# Patient Record
Sex: Female | Born: 1990 | Hispanic: Yes | Marital: Married | Smoking: Never smoker
Health system: Southern US, Community
[De-identification: ages and names within clinical notes are randomized; demographics above are authoritative.]

## PROBLEM LIST (undated history)

## (undated) DIAGNOSIS — D649 Anemia, unspecified: Secondary | ICD-10-CM

## (undated) HISTORY — DX: Anemia, unspecified: D64.9

## (undated) HISTORY — PX: CHOLECYSTECTOMY: SHX55

---
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
## 2010-06-02 ENCOUNTER — Emergency Department: Payer: Self-pay | Admitting: Emergency Medicine

## 2010-07-06 ENCOUNTER — Emergency Department: Payer: Self-pay | Admitting: Unknown Physician Specialty

## 2013-09-04 ENCOUNTER — Emergency Department: Payer: Self-pay | Admitting: Emergency Medicine

## 2013-09-04 LAB — URINALYSIS, COMPLETE
Bilirubin,UR: NEGATIVE
Glucose,UR: NEGATIVE mg/dL (ref 0–75)
Nitrite: NEGATIVE
Ph: 5 (ref 4.5–8.0)
Protein: NEGATIVE
RBC,UR: 6 /HPF (ref 0–5)
Specific Gravity: 1.019 (ref 1.003–1.030)
WBC UR: 18 /HPF (ref 0–5)

## 2013-09-04 LAB — CBC
HCT: 37 % (ref 35.0–47.0)
HGB: 13.1 g/dL (ref 12.0–16.0)
MCH: 29.1 pg (ref 26.0–34.0)
MCHC: 35.5 g/dL (ref 32.0–36.0)
MCV: 82 fL (ref 80–100)
RDW: 13.1 % (ref 11.5–14.5)
WBC: 9.9 10*3/uL (ref 3.6–11.0)

## 2013-09-04 LAB — HCG, QUANTITATIVE, PREGNANCY: Beta Hcg, Quant.: 61846 m[IU]/mL — ABNORMAL HIGH

## 2013-11-11 ENCOUNTER — Emergency Department: Payer: Self-pay | Admitting: Emergency Medicine

## 2013-11-11 LAB — RAPID INFLUENZA A&B ANTIGENS

## 2013-11-14 LAB — BETA STREP CULTURE(ARMC)

## 2014-03-19 ENCOUNTER — Observation Stay: Payer: Self-pay | Admitting: Obstetrics and Gynecology

## 2014-03-19 LAB — URINALYSIS, COMPLETE
BILIRUBIN, UR: NEGATIVE
Blood: NEGATIVE
Glucose,UR: 150 mg/dL (ref 0–75)
KETONE: NEGATIVE
Nitrite: NEGATIVE
PH: 6 (ref 4.5–8.0)
PROTEIN: NEGATIVE
RBC,UR: <1 /HPF (ref 0–5)
Specific Gravity: 1.015 (ref 1.003–1.030)
WBC UR: 1 /HPF (ref 0–5)

## 2014-04-29 ENCOUNTER — Emergency Department: Payer: Self-pay | Admitting: Emergency Medicine

## 2014-04-29 LAB — COMPREHENSIVE METABOLIC PANEL
ALK PHOS: 212 U/L — AB
ALT: 26 U/L (ref 12–78)
Albumin: 2.4 g/dL — ABNORMAL LOW (ref 3.4–5.0)
Anion Gap: 9 (ref 7–16)
BUN: 3 mg/dL — ABNORMAL LOW (ref 7–18)
Bilirubin,Total: 0.3 mg/dL (ref 0.2–1.0)
CREATININE: 0.51 mg/dL — AB (ref 0.60–1.30)
Calcium, Total: 8.7 mg/dL (ref 8.5–10.1)
Chloride: 111 mmol/L — ABNORMAL HIGH (ref 98–107)
Co2: 22 mmol/L (ref 21–32)
EGFR (African American): >60
EGFR (Non-African Amer.): >60
Glucose: 76 mg/dL (ref 65–99)
Osmolality: 278 (ref 275–301)
Potassium: 3.6 mmol/L (ref 3.5–5.1)
SGOT(AST): 42 U/L — ABNORMAL HIGH (ref 15–37)
SODIUM: 142 mmol/L (ref 136–145)
Total Protein: 6.5 g/dL (ref 6.4–8.2)

## 2014-04-29 LAB — CBC
HCT: 24.6 % — ABNORMAL LOW (ref 35.0–47.0)
HGB: 8 g/dL — AB (ref 12.0–16.0)
MCH: 24.6 pg — AB (ref 26.0–34.0)
MCHC: 32.5 g/dL (ref 32.0–36.0)
MCV: 76 fL — ABNORMAL LOW (ref 80–100)
Platelet: 270 10*3/uL (ref 150–440)
RBC: 3.26 10*6/uL — ABNORMAL LOW (ref 3.80–5.20)
RDW: 16.8 % — AB (ref 11.5–14.5)
WBC: 5.1 10*3/uL (ref 3.6–11.0)

## 2014-04-29 LAB — URINALYSIS, COMPLETE
BACTERIA: NONE SEEN
Bilirubin,UR: NEGATIVE
GLUCOSE, UR: NEGATIVE mg/dL (ref 0–75)
KETONE: NEGATIVE
NITRITE: NEGATIVE
Ph: 6 (ref 4.5–8.0)
Protein: 100
RBC,UR: 1933 /HPF (ref 0–5)
Specific Gravity: 1.013 (ref 1.003–1.030)
WBC UR: 272 /HPF (ref 0–5)

## 2014-05-05 DIAGNOSIS — G510 Bell's palsy: Secondary | ICD-10-CM

## 2014-05-05 HISTORY — DX: Bell's palsy: G51.0

## 2014-07-17 DIAGNOSIS — E669 Obesity, unspecified: Secondary | ICD-10-CM | POA: Insufficient documentation

## 2015-09-04 ENCOUNTER — Emergency Department
Admission: EM | Admit: 2015-09-04 | Discharge: 2015-09-04 | Disposition: A | Discharge status: Home / Self Care | Payer: Self-pay | Attending: Emergency Medicine | Admitting: Emergency Medicine

## 2015-09-04 ENCOUNTER — Emergency Department: Payer: Self-pay

## 2015-09-04 DIAGNOSIS — K8020 Calculus of gallbladder without cholecystitis without obstruction: Secondary | ICD-10-CM | POA: Insufficient documentation

## 2015-09-04 DIAGNOSIS — Z3202 Encounter for pregnancy test, result negative: Secondary | ICD-10-CM | POA: Insufficient documentation

## 2015-09-04 DIAGNOSIS — K2970 Gastritis, unspecified, without bleeding: Secondary | ICD-10-CM | POA: Insufficient documentation

## 2015-09-04 LAB — COMPREHENSIVE METABOLIC PANEL
ALT: 98 U/L — ABNORMAL HIGH (ref 14–54)
ANION GAP: 6 (ref 5–15)
AST: 50 U/L — ABNORMAL HIGH (ref 15–41)
Albumin: 4.2 g/dL (ref 3.5–5.0)
Alkaline Phosphatase: 95 U/L (ref 38–126)
BILIRUBIN TOTAL: 0.7 mg/dL (ref 0.3–1.2)
BUN: 14 mg/dL (ref 6–20)
CALCIUM: 9.5 mg/dL (ref 8.9–10.3)
CO2: 27 mmol/L (ref 22–32)
Chloride: 106 mmol/L (ref 101–111)
Creatinine, Ser: 0.51 mg/dL (ref 0.44–1.00)
GFR calc non Af Amer: >60 mL/min (ref >60)
Glucose, Bld: 114 mg/dL — ABNORMAL HIGH (ref 65–99)
Potassium: 3.9 mmol/L (ref 3.5–5.1)
SODIUM: 139 mmol/L (ref 135–145)
TOTAL PROTEIN: 7.7 g/dL (ref 6.5–8.1)

## 2015-09-04 LAB — CBC
HCT: 39.5 % (ref 35.0–47.0)
HEMOGLOBIN: 13.6 g/dL (ref 12.0–16.0)
MCH: 28.6 pg (ref 26.0–34.0)
MCHC: 34.4 g/dL (ref 32.0–36.0)
MCV: 83.2 fL (ref 80.0–100.0)
Platelets: 227 10*3/uL (ref 150–440)
RBC: 4.74 MIL/uL (ref 3.80–5.20)
RDW: 13.2 % (ref 11.5–14.5)
WBC: 7.9 10*3/uL (ref 3.6–11.0)

## 2015-09-04 LAB — URINALYSIS COMPLETE WITH MICROSCOPIC (ARMC ONLY)
Bacteria, UA: NONE SEEN
Bilirubin Urine: NEGATIVE
Glucose, UA: NEGATIVE mg/dL
Hgb urine dipstick: NEGATIVE
KETONES UR: NEGATIVE mg/dL
LEUKOCYTES UA: NEGATIVE
NITRITE: NEGATIVE
PH: 5 (ref 5.0–8.0)
PROTEIN: NEGATIVE mg/dL
SPECIFIC GRAVITY, URINE: 1.016 (ref 1.005–1.030)

## 2015-09-04 LAB — POCT PREGNANCY, URINE: Preg Test, Ur: NEGATIVE

## 2015-09-04 LAB — LIPASE, BLOOD: Lipase: 36 U/L (ref 11–51)

## 2015-09-04 MED ORDER — METOCLOPRAMIDE HCL 5 MG/ML IJ SOLN
10.0000 mg | Freq: Once | INTRAMUSCULAR | Status: AC
  Administered 2015-09-04: 10 mg via INTRAVENOUS
  Filled 2015-09-04: qty 2

## 2015-09-04 MED ORDER — ONDANSETRON 8 MG PO TBDP: 8.0000 mg | ORAL_TABLET | Freq: Three times a day (TID) | ORAL | Status: DC | PRN

## 2015-09-04 MED ORDER — FAMOTIDINE IN NACL 20-0.9 MG/50ML-% IV SOLN
20.0000 mg | Freq: Once | INTRAVENOUS | Status: AC
  Administered 2015-09-04: 20 mg via INTRAVENOUS
  Filled 2015-09-04: qty 50

## 2015-09-04 MED ORDER — RANITIDINE HCL 150 MG PO CAPS: 150.0000 mg | ORAL_CAPSULE | Freq: Two times a day (BID) | ORAL | Status: DC

## 2015-09-04 NOTE — Discharge Instructions (Signed)
Cholelithiasis Cholelithiasis (also called gallstones) is a form of gallbladder disease in which gallstones form in your gallbladder. The gallbladder is an organ that stores bile made in the liver, which helps digest fats. Gallstones begin as small crystals and slowly grow into stones. Gallstone pain occurs when the gallbladder spasms and a gallstone is blocking the duct. Pain can also occur when a stone passes out of the duct.  RISK FACTORS  Being female.   Having multiple pregnancies. Health care providers sometimes advise removing diseased gallbladders before future pregnancies.   Being obese.  Eating a diet heavy in fried foods and fat.   Being older than 60 years and increasing age.   Prolonged use of medicines containing female hormones.   Having diabetes mellitus.   Rapidly losing weight.   Having a family history of gallstones (heredity).  SYMPTOMS  Nausea.   Vomiting.  Abdominal pain.   Yellowing of the skin (jaundice).   Sudden pain. It may persist from several minutes to several hours.  Fever.   Tenderness to the touch. In some cases, when gallstones do not move into the bile duct, people have no pain or symptoms. These are called "silent" gallstones.  TREATMENT Silent gallstones do not need treatment. In severe cases, emergency surgery may be required. Options for treatment include:  Surgery to remove the gallbladder. This is the most common treatment.  Medicines. These do not always work and may take 6-12 months or more to work.  Shock wave treatment (extracorporeal biliary lithotripsy). In this treatment an ultrasound machine sends shock waves to the gallbladder to break gallstones into smaller pieces that can pass into the intestines or be dissolved by medicine. HOME CARE INSTRUCTIONS   Only take over-the-counter or prescription medicines for pain, discomfort, or fever as directed by your health care provider.   Follow a low-fat diet until  seen again by your health care provider. Fat causes the gallbladder to contract, which can result in pain.   Follow up with your health care provider as directed. Attacks are almost always recurrent and surgery is usually required for permanent treatment.  SEEK IMMEDIATE MEDICAL CARE IF:   Your pain increases and is not controlled by medicines.   You have a fever or persistent symptoms for more than 2-3 days.   You have a fever and your symptoms suddenly get worse.   You have persistent nausea and vomiting.  MAKE SURE YOU:   Understand these instructions.  Will watch your condition.  Will get help right away if you are not doing well or get worse.   This information is not intended to replace advice given to you by your health care provider. Make sure you discuss any questions you have with your health care provider.   Document Released: 10/19/2005 Document Revised: 06/25/2013 Document Reviewed: 04/16/2013 Elsevier Interactive Patient Education 2016 Elsevier Inc.  Gastritis, Adult Gastritis is soreness and swelling (inflammation) of the lining of the stomach. Gastritis can develop as a sudden onset (acute) or long-term (chronic) condition. If gastritis is not treated, it can lead to stomach bleeding and ulcers. CAUSES  Gastritis occurs when the stomach lining is weak or damaged. Digestive juices from the stomach then inflame the weakened stomach lining. The stomach lining may be weak or damaged due to viral or bacterial infections. One common bacterial infection is the Helicobacter pylori infection. Gastritis can also result from excessive alcohol consumption, taking certain medicines, or having too much acid in the stomach.  SYMPTOMS  In  some cases, there are no symptoms. When symptoms are present, they may include:  Pain or a burning sensation in the upper abdomen.  Nausea.  Vomiting.  An uncomfortable feeling of fullness after eating. DIAGNOSIS  Your caregiver may  suspect you have gastritis based on your symptoms and a physical exam. To determine the cause of your gastritis, your caregiver may perform the following:  Blood or stool tests to check for the H pylori bacterium.  Gastroscopy. A thin, flexible tube (endoscope) is passed down the esophagus and into the stomach. The endoscope has a light and camera on the end. Your caregiver uses the endoscope to view the inside of the stomach.  Taking a tissue sample (biopsy) from the stomach to examine under a microscope. TREATMENT  Depending on the cause of your gastritis, medicines may be prescribed. If you have a bacterial infection, such as an H pylori infection, antibiotics may be given. If your gastritis is caused by too much acid in the stomach, H2 blockers or antacids may be given. Your caregiver may recommend that you stop taking aspirin, ibuprofen, or other nonsteroidal anti-inflammatory drugs (NSAIDs). HOME CARE INSTRUCTIONS  Only take over-the-counter or prescription medicines as directed by your caregiver.  If you were given antibiotic medicines, take them as directed. Finish them even if you start to feel better.  Drink enough fluids to keep your urine clear or pale yellow.  Avoid foods and drinks that make your symptoms worse, such as:  Caffeine or alcoholic drinks.  Chocolate.  Peppermint or mint flavorings.  Garlic and onions.  Spicy foods.  Citrus fruits, such as oranges, lemons, or limes.  Tomato-based foods such as sauce, chili, salsa, and pizza.  Fried and fatty foods.  Eat small, frequent meals instead of large meals. SEEK IMMEDIATE MEDICAL CARE IF:   You have black or dark red stools.  You vomit blood or material that looks like coffee grounds.  You are unable to keep fluids down.  Your abdominal pain gets worse.  You have a fever.  You do not feel better after 1 week.  You have any other questions or concerns. MAKE SURE YOU:  Understand these  instructions.  Will watch your condition.  Will get help right away if you are not doing well or get worse.   This information is not intended to replace advice given to you by your health care provider. Make sure you discuss any questions you have with your health care provider.   Document Released: 10/17/2001 Document Revised: 04/23/2012 Document Reviewed: 12/06/2011 Elsevier Interactive Patient Education [employer] Elsevier Inc.

## 2015-09-04 NOTE — ED Notes (Signed)
Patient reports abdominal pain that started around 9pm with nausea.

## 2015-09-04 NOTE — ED Notes (Signed)
Discussed discharge instructions, prescriptions, and follow-up care with patient. No questions or concerns at this time. Pt stable at discharge.  

## 2015-09-04 NOTE — ED Provider Notes (Signed)
[facility] Emergency Department Provider Note  ____________________________________________  Time seen: 5:00 AM  I have reviewed the triage vital signs and the nursing notes.   HISTORY  Chief Complaint Abdominal Pain    HPI Deanna KaiserJessica Nixon is a 24 y.o. female who reports nausea and bilateral upper abdominal pain around 9 PM tonight. She reports that she has been having pain for the last 2-3 days in waxing and waning episodes that last several hours. It seems to be worse after eating sometimes but not always. She is not had any fevers or chills, no vomiting or diarrhea, no chest pain or shortness of breath.     No past medical history on file. None  There are no active problems to display for this patient.    No past surgical history on file. None  Current Outpatient Rx  Name  Route  Sig  Dispense  Refill  . ondansetron (ZOFRAN ODT) 8 MG disintegrating tablet   Oral   Take 1 tablet (8 mg total) by mouth every 8 (eight) hours as needed for nausea or vomiting.   20 tablet   0   . ranitidine (ZANTAC) 150 MG capsule   Oral   Take 1 capsule (150 mg total) by mouth 2 (two) times daily.   28 capsule   0      Allergies Review of patient's allergies indicates no known allergies.   No family history on file.  Social History Social History  Substance Use Topics  . Smoking status: Not on file  . Smokeless tobacco: Not on file  . Alcohol Use: Not on file   no tobacco alcohol or drug use  Review of Systems  Constitutional:   No fever or chills. No weight changes Eyes:   No blurry vision or double vision.  ENT:   No sore throat. Cardiovascular:   No chest pain. Respiratory:   No dyspnea or cough. Gastrointestinal:   Positive for abdominal pain, without vomiting and diarrhea.  No BRBPR or melena. Genitourinary:   Negative for dysuria, urinary retention, bloody urine, or difficulty urinating. Musculoskeletal:   Negative for back pain. No  joint swelling or pain. Skin:   Negative for rash. Neurological:   Negative for headaches, focal weakness or numbness. Psychiatric:  No anxiety or depression.   Endocrine:  No hot/cold intolerance, changes in energy, or sleep difficulty.  10-point ROS otherwise negative.  ____________________________________________   PHYSICAL EXAM:  VITAL SIGNS: ED Triage Vitals  Enc Vitals Group     BP 09/04/15 0340 131/96 mmHg     Pulse Rate 09/04/15 0340 77     Resp 09/04/15 0340 20     Temp 09/04/15 0340 97.7 F (36.5 C)     Temp Source 09/04/15 0340 Oral     SpO2 09/04/15 0340 99 %     Weight 09/04/15 0340 187 lb (84.823 kg)     Height 09/04/15 0340 5' (1.524 m)     Head Cir --      Peak Flow --      Pain Score 09/04/15 0338 10     Pain Loc --      Pain Edu? --      Excl. in [place]? --      Constitutional:   Alert and oriented. Well appearing and in no distress. Eyes:   No scleral icterus. No conjunctival pallor. PERRL. EOMI ENT   Head:   Normocephalic and atraumatic.   Nose:   No congestion/rhinnorhea. No septal hematoma  Mouth/Throat:   MMM, no pharyngeal erythema. No peritonsillar mass. No uvula shift.   Neck:   No stridor. No SubQ emphysema. No meningismus. Hematological/Lymphatic/Immunilogical:   No cervical lymphadenopathy. Cardiovascular:   RRR. Normal and symmetric distal pulses are present in all extremities. No murmurs, rubs, or gallops. Respiratory:   Normal respiratory effort without tachypnea nor retractions. Breath sounds are clear and equal bilaterally. No wheezes/rales/rhonchi. Gastrointestinal:  Soft with diffuse upper abdominal tenderness, moderate, nonfocal.. No distention. There is no CVA tenderness.  No rebound, rigidity, or guarding. Genitourinary:   deferred Musculoskeletal:   Nontender with normal range of motion in all extremities. No joint effusions.  No lower extremity tenderness.  No edema. Neurologic:   Normal speech and language.  CN 2-10  normal. Motor grossly intact. No pronator drift.  Normal gait. No gross focal neurologic deficits are appreciated.  Skin:    Skin is warm, dry and intact. No rash noted.  No petechiae, purpura, or bullae. Psychiatric:   Mood and affect are normal. Speech and behavior are normal. Patient exhibits appropriate insight and judgment.  ____________________________________________    LABS (pertinent positives/negatives) (all labs ordered are listed, but only abnormal results are displayed) Labs Reviewed  COMPREHENSIVE METABOLIC PANEL - Abnormal; Notable for the following:    Glucose, Bld 114 (*)    AST 50 (*)    ALT 98 (*)    All other components within normal limits  URINALYSIS COMPLETEWITH MICROSCOPIC ([facility] ONLY) - Abnormal; Notable for the following:    Color, Urine YELLOW (*)    APPearance CLEAR (*)    Squamous Epithelial / LPF 0-5 (*)    All other components within normal limits  LIPASE, BLOOD  CBC  POC URINE PREG, ED  POCT PREGNANCY, URINE   ____________________________________________   EKG    ____________________________________________    RADIOLOGY  Ultrasound right upper quadrant shows nonmobile 1.3 cm gallstone at the gallbladder neck without evidence of cholecystitis  ____________________________________________   PROCEDURES   ____________________________________________   INITIAL IMPRESSION / ASSESSMENT AND PLAN / ED COURSE  Pertinent labs & imaging results that were available during my care of the patient were reviewed by me and considered in my medical decision making (see chart for details).  Patient presents with poorly localized upper abdominal tenderness with nausea, no vomiting, tolerating oral intake although it does mildly worsen her symptoms recently. Labs show mild elevation in her transaminases, so we'll ultrasound the gallbladder although low suspicion for cholecystitis.  ----------------------------------------- 7:34 AM on  09/04/2015 -----------------------------------------  Ultrasound shows a gallstone in the neck. Patient reports that she feels much better after antiemetics and antacids. Repeat abdominal exam shows that she is completely nontender. Patient tolerating oral intake without worsening symptoms, so we will discharge her and have her follow up with surgery clinic this week.     ____________________________________________   FINAL CLINICAL IMPRESSION(S) / ED DIAGNOSES  Final diagnoses:  Calculus of gallbladder without cholecystitis without obstruction  Gastritis      Sharman Cheek, MD 09/04/15 [phone]

## 2015-09-13 ENCOUNTER — Ambulatory Visit: Payer: Self-pay | Admitting: Surgery

## 2016-06-29 DIAGNOSIS — Z8759 Personal history of other complications of pregnancy, childbirth and the puerperium: Secondary | ICD-10-CM | POA: Insufficient documentation

## 2016-06-29 DIAGNOSIS — Z8632 Personal history of gestational diabetes: Secondary | ICD-10-CM | POA: Insufficient documentation

## 2016-06-29 LAB — HM HIV SCREENING LAB: HM HIV Screening: NEGATIVE

## 2016-06-29 LAB — HM PAP SMEAR: HM Pap smear: NEGATIVE

## 2019-06-07 DIAGNOSIS — U071 COVID-19: Secondary | ICD-10-CM

## 2019-06-07 HISTORY — DX: COVID-19: U07.1

## 2019-07-17 ENCOUNTER — Encounter: Payer: Self-pay | Admitting: Emergency Medicine

## 2019-07-17 ENCOUNTER — Emergency Department: Payer: 59

## 2019-07-17 ENCOUNTER — Other Ambulatory Visit: Payer: Self-pay

## 2019-07-17 ENCOUNTER — Emergency Department
Admission: EM | Admit: 2019-07-17 | Discharge: 2019-07-18 | Disposition: A | Discharge status: Home / Self Care | Payer: 59 | Attending: Emergency Medicine | Admitting: Emergency Medicine

## 2019-07-17 DIAGNOSIS — R109 Unspecified abdominal pain: Secondary | ICD-10-CM | POA: Insufficient documentation

## 2019-07-17 LAB — CBC
HCT: 44.1 % (ref 36.0–46.0)
Hemoglobin: 15.3 g/dL — ABNORMAL HIGH (ref 12.0–15.0)
MCH: 28.5 pg (ref 26.0–34.0)
MCHC: 34.7 g/dL (ref 30.0–36.0)
MCV: 82.3 fL (ref 80.0–100.0)
Platelets: 245 10*3/uL (ref 150–400)
RBC: 5.36 MIL/uL — ABNORMAL HIGH (ref 3.87–5.11)
RDW: 14.3 % (ref 11.5–15.5)
WBC: 6.8 10*3/uL (ref 4.0–10.5)
nRBC: 0 % (ref 0.0–0.2)

## 2019-07-17 LAB — URINALYSIS, COMPLETE (UACMP) WITH MICROSCOPIC
Bacteria, UA: NONE SEEN
Bilirubin Urine: NEGATIVE
Glucose, UA: >=500 mg/dL — AB
Hgb urine dipstick: NEGATIVE
Ketones, ur: 80 mg/dL — AB
Leukocytes,Ua: NEGATIVE
Nitrite: NEGATIVE
Protein, ur: 100 mg/dL — AB
Specific Gravity, Urine: 1.032 — ABNORMAL HIGH (ref 1.005–1.030)
pH: 6 (ref 5.0–8.0)

## 2019-07-17 LAB — COMPREHENSIVE METABOLIC PANEL
ALT: 116 U/L — ABNORMAL HIGH (ref 0–44)
AST: 80 U/L — ABNORMAL HIGH (ref 15–41)
Albumin: 4.6 g/dL (ref 3.5–5.0)
Alkaline Phosphatase: 134 U/L — ABNORMAL HIGH (ref 38–126)
Anion gap: 10 (ref 5–15)
BUN: 7 mg/dL (ref 6–20)
CO2: 27 mmol/L (ref 22–32)
Calcium: 9.3 mg/dL (ref 8.9–10.3)
Chloride: 99 mmol/L (ref 98–111)
Creatinine, Ser: 0.68 mg/dL (ref 0.44–1.00)
GFR calc Af Amer: >60 mL/min (ref >60)
GFR calc non Af Amer: >60 mL/min (ref >60)
Glucose, Bld: 261 mg/dL — ABNORMAL HIGH (ref 70–99)
Potassium: 4.4 mmol/L (ref 3.5–5.1)
Sodium: 136 mmol/L (ref 135–145)
Total Bilirubin: 1 mg/dL (ref 0.3–1.2)
Total Protein: 7.9 g/dL (ref 6.5–8.1)

## 2019-07-17 LAB — POCT PREGNANCY, URINE: Preg Test, Ur: NEGATIVE

## 2019-07-17 LAB — LIPASE, BLOOD: Lipase: 32 U/L (ref 11–51)

## 2019-07-17 MED ORDER — IOHEXOL 300 MG/ML  SOLN
100.0000 mL | Freq: Once | INTRAMUSCULAR | Status: AC | PRN
  Administered 2019-07-17: 100 mL via INTRAVENOUS
  Filled 2019-07-17: qty 100

## 2019-07-17 MED ORDER — SODIUM CHLORIDE 0.9% FLUSH: 3.0000 mL | Freq: Once | INTRAVENOUS | Status: DC

## 2019-07-17 MED ORDER — HYDROXYZINE PAMOATE 100 MG PO CAPS: 100.0000 mg | ORAL_CAPSULE | Freq: Three times a day (TID) | ORAL | 0 refills | Status: AC | PRN

## 2019-07-17 MED ORDER — ONDANSETRON 4 MG PO TBDP
4.0000 mg | ORAL_TABLET | Freq: Once | ORAL | Status: AC
  Administered 2019-07-17: 4 mg via ORAL
  Filled 2019-07-17: qty 1

## 2019-07-17 MED ORDER — RANITIDINE HCL 150 MG PO CAPS: 150.0000 mg | ORAL_CAPSULE | Freq: Two times a day (BID) | ORAL | 0 refills | Status: DC

## 2019-07-17 MED ORDER — ALUM & MAG HYDROXIDE-SIMETH 200-200-20 MG/5ML PO SUSP
30.0000 mL | Freq: Once | ORAL | Status: AC
  Administered 2019-07-17: 30 mL via ORAL
  Filled 2019-07-17: qty 30

## 2019-07-17 MED ORDER — OXYCODONE-ACETAMINOPHEN 5-325 MG PO TABS
1.0000 | ORAL_TABLET | Freq: Once | ORAL | Status: AC
  Administered 2019-07-17: 1 via ORAL
  Filled 2019-07-17: qty 1

## 2019-07-17 NOTE — ED Provider Notes (Signed)
[facility] Emergency Department Provider Note  ____________________________________________  Time seen: Approximately 9:29 PM  I have reviewed the triage vital signs and the nursing notes.   HISTORY  Chief Complaint Abdominal Pain    HPI Deanna Nixon is a 28 y.o. female presents to the emergency department with right upper, epigastric and left upper quadrant abdominal pain.  Patient states that pain is been constant over the past 2 weeks.  She has had no fever at home.  No emesis or diarrhea.  Patient states that she was referred from urgent care as she has blood glucose of 240 and states that she thinks she needs insulin.  Abdominal pain does not worsen with eating.  Patient states that she is uncertain about possibility of pregnancy.  Patient states that she is currently nauseated but has not had any vomiting.  No other alleviating measures have been attempted.        History reviewed. No pertinent past medical history.  There are no active problems to display for this patient.   History reviewed. No pertinent surgical history.  Prior to Admission medications   Medication Sig Start Date End Date Taking? Authorizing Provider  ondansetron (ZOFRAN ODT) 8 MG disintegrating tablet Take 1 tablet (8 mg total) by mouth every 8 (eight) hours as needed for nausea or vomiting. 09/04/15   Carrie Mew, MD  ranitidine (ZANTAC) 150 MG capsule Take 1 capsule (150 mg total) by mouth 2 (two) times daily for 14 days. 07/17/19 07/31/19  Lannie Fields, PA-C    Allergies Patient has no known allergies.  No family history on file.  Social History Social History   Tobacco Use  . Smoking status: Never Smoker  . Smokeless tobacco: Never Used  Substance Use Topics  . Alcohol use: Not Currently  . Drug use: Never     Review of Systems  Constitutional: No fever/chills Eyes: No visual changes. No discharge ENT: No upper respiratory  complaints. Cardiovascular: no chest pain. Respiratory: no cough. No SOB. Gastrointestinal: Patient has abdominal pain. Genitourinary: Negative for dysuria. No hematuria Musculoskeletal: Negative for musculoskeletal pain. Skin: Negative for rash, abrasions, lacerations, ecchymosis. Neurological: Negative for headaches, focal weakness or numbness.   ____________________________________________   PHYSICAL EXAM:  VITAL SIGNS: ED Triage Vitals [07/17/19 1658]  Enc Vitals Group     BP (!) 152/95     Pulse Rate (!) 102     Resp 16     Temp 98.5 F (36.9 C)     Temp Source Oral     SpO2 98 %     Weight      Height      Head Circumference      Peak Flow      Pain Score 7     Pain Loc      Pain Edu?      Excl. in [place]?      Constitutional: Alert and oriented. Well appearing and in no acute distress. Eyes: Conjunctivae are normal. PERRL. EOMI. Head: Atraumatic. Cardiovascular: Normal rate, regular rhythm. Normal S1 and S2.  Good peripheral circulation. Respiratory: Normal respiratory effort without tachypnea or retractions. Lungs CTAB. Good air entry to the bases with no decreased or absent breath sounds. Gastrointestinal: Patient has no abdominal tenderness or guarding to palpation. Musculoskeletal: Full range of motion to all extremities. No gross deformities appreciated. Neurologic:  Normal speech and language. No gross focal neurologic deficits are appreciated.  Skin:  Skin is warm, dry and intact. No rash  noted. Psychiatric: Mood and affect are normal. Speech and behavior are normal. Patient exhibits appropriate insight and judgement.   ____________________________________________   LABS (all labs ordered are listed, but only abnormal results are displayed)  Labs Reviewed  COMPREHENSIVE METABOLIC PANEL - Abnormal; Notable for the following components:      Result Value   Glucose, Bld 261 (*)    AST 80 (*)    ALT 116 (*)    Alkaline Phosphatase 134 (*)    All other  components within normal limits  CBC - Abnormal; Notable for the following components:   RBC 5.36 (*)    Hemoglobin 15.3 (*)    All other components within normal limits  URINALYSIS, COMPLETE (UACMP) WITH MICROSCOPIC - Abnormal; Notable for the following components:   Color, Urine YELLOW (*)    APPearance HAZY (*)    Specific Gravity, Urine 1.032 (*)    Glucose, UA >=500 (*)    Ketones, ur 80 (*)    Protein, ur 100 (*)    All other components within normal limits  LIPASE, BLOOD  HEPATITIS PANEL, ACUTE  POC URINE PREG, ED  POC URINE PREG, ED  POCT PREGNANCY, URINE   ____________________________________________  EKG   ____________________________________________  RADIOLOGY    Ct Abdomen Pelvis W Contrast  Result Date: 07/17/2019 CLINICAL DATA:  Abdominal pain for 2 weeks EXAM: CT ABDOMEN AND PELVIS WITH CONTRAST TECHNIQUE: Multidetector CT imaging of the abdomen and pelvis was performed using the standard protocol following bolus administration of intravenous contrast. CONTRAST:  168m OMNIPAQUE IOHEXOL 300 MG/ML  SOLN COMPARISON:  None. FINDINGS: LOWER CHEST: There is no basilar pleural or apical pericardial effusion. HEPATOBILIARY: Diffuse hypoattenuation of the liver relative to the spleen suggests hepatic steatosis. No focal liver lesion or biliary dilatation. The gallbladder is normal. PANCREAS: The pancreatic parenchymal contours are normal and there is no ductal dilatation. There is no peripancreatic fluid collection. SPLEEN: Normal. ADRENALS/URINARY TRACT: --Adrenal glands: Normal. --Right kidney/ureter: No hydronephrosis, nephroureterolithiasis, perinephric stranding or solid renal mass. --Left kidney/ureter: No hydronephrosis, nephroureterolithiasis, perinephric stranding or solid renal mass. --Urinary bladder: Normal for degree of distention STOMACH/BOWEL: --Stomach/Duodenum: There is no hiatal hernia or other gastric abnormality. The duodenal course and caliber are normal.  --Small bowel: No dilatation or inflammation. --Colon: No focal abnormality. --Appendix: Normal. VASCULAR/LYMPHATIC: Normal course and caliber of the major abdominal vessels. No abdominal or pelvic lymphadenopathy. REPRODUCTIVE: Normal uterus and ovaries. MUSCULOSKELETAL. No bony spinal canal stenosis or focal osseous abnormality. OTHER: None. IMPRESSION: 1. No acute abdominal or pelvic abnormality. 2. Hepatic steatosis. Electronically Signed   By: KUlyses JarredM.D.   On: 07/17/2019 23:31    ____________________________________________    PROCEDURES  Procedure(s) performed:    Procedures    Medications  alum & mag hydroxide-simeth (MAALOX/MYLANTA) 200-200-20 MG/5ML suspension 30 mL (30 mLs Oral Given 07/17/19 2206)  oxyCODONE-acetaminophen (PERCOCET/ROXICET) 5-325 MG per tablet 1 tablet (1 tablet Oral Given 07/17/19 2249)  ondansetron (ZOFRAN-ODT) disintegrating tablet 4 mg (4 mg Oral Given 07/17/19 2249)  iohexol (OMNIPAQUE) 300 MG/ML solution 100 mL (100 mLs Intravenous Contrast Given 07/17/19 2310)     ____________________________________________   INITIAL IMPRESSION / ASSESSMENT AND PLAN / ED COURSE  Pertinent labs & imaging results that were available during my care of the patient were reviewed by me and considered in my medical decision making (see chart for details).  Review of the [place] CSRS was performed in accordance of the [place] to dispensing any controlled drugs.  Assessment and plan Abdominal pain 28 year old female presents to the emergency department with right upper quadrant, left upper quadrant and epigastric abdominal pain that has occurred constantly for the past 2 weeks.  Physical exam, patient is hypertensive and tachycardic but is not febrile.  She appears to be resting comfortably and has no guarding or tenderness elicited to palpation on abdominal exam.  Differential diagnosis includes constipation, cholecystitis, pancreatitis, hepatitis,  gastritis, pregnancy  No leukocytosis on CBC.  AST, ALT and alk phos were elevated.  Lipase was within reference range.  Hepatitis panel is pending at this time.  Pregnancy testing was negative.  CT abdomen and pelvis revealed no abnormality.  Patient reported that her pain improved significantly after Percocet and a GI cocktail.  She was discharged with ranitidine and advised to follow-up with GI as needed.     ____________________________________________  FINAL CLINICAL IMPRESSION(S) / ED DIAGNOSES  Final diagnoses:  Abdominal pain, unspecified abdominal location      NEW MEDICATIONS STARTED DURING THIS VISIT:  ED Discharge Orders         Ordered    ranitidine (ZANTAC) 150 MG capsule  2 times daily     07/17/19 2337              This chart was dictated using voice recognition software/Dragon. Despite best efforts to proofread, errors can occur which can change the meaning. Any change was purely unintentional.    Lannie Fields, PA-C 07/17/19 2348    Vanessa , MD 07/22/19 1153

## 2019-07-17 NOTE — ED Triage Notes (Signed)
PT sent from [facility] with c/o abd pain x2wks. Pt had glucose of 240 per paperwork. PT has no hx of diabetes. PT in NAD,VSS

## 2019-07-18 NOTE — ED Notes (Signed)
[facility] called and ranitidine is off the market.  Per dr Ellender Hose can use otc pepsid.

## 2019-07-19 LAB — HEPATITIS PANEL, ACUTE
HCV Ab: <0.1 s/co ratio (ref 0.0–0.9)
Hep A IgM: NEGATIVE
Hep B C IgM: NEGATIVE
Hepatitis B Surface Ag: NEGATIVE

## 2019-07-24 DIAGNOSIS — Z8632 Personal history of gestational diabetes: Secondary | ICD-10-CM

## 2019-07-24 DIAGNOSIS — Z8759 Personal history of other complications of pregnancy, childbirth and the puerperium: Secondary | ICD-10-CM

## 2019-07-24 DIAGNOSIS — Z6841 Body Mass Index (BMI) 40.0 and over, adult: Secondary | ICD-10-CM

## 2019-07-25 ENCOUNTER — Ambulatory Visit (LOCAL_COMMUNITY_HEALTH_CENTER): Payer: 59 | Admitting: Advanced Practice Midwife

## 2019-07-25 ENCOUNTER — Other Ambulatory Visit: Payer: Self-pay

## 2019-07-25 VITALS — BP 121/81 | Ht 60.0 in | Wt 203.2 lb

## 2019-07-25 DIAGNOSIS — Z113 Encounter for screening for infections with a predominantly sexual mode of transmission: Secondary | ICD-10-CM

## 2019-07-25 DIAGNOSIS — B379 Candidiasis, unspecified: Secondary | ICD-10-CM

## 2019-07-25 DIAGNOSIS — Z3009 Encounter for other general counseling and advice on contraception: Secondary | ICD-10-CM

## 2019-07-25 DIAGNOSIS — Z3046 Encounter for surveillance of implantable subdermal contraceptive: Secondary | ICD-10-CM

## 2019-07-25 LAB — WET PREP FOR TRICH, YEAST, CLUE
Clue Cell Exam: NEGATIVE
Trichomonas Exam: NEGATIVE

## 2019-07-25 MED ORDER — MULTIVITAMINS PO CAPS: 1.0000 | ORAL_CAPSULE | Freq: Every day | ORAL | 0 refills | Status: DC

## 2019-07-25 MED ORDER — CLOTRIMAZOLE 1 % VA CREA: 1.0000 | TOPICAL_CREAM | Freq: Every day | VAGINAL | 0 refills | Status: AC

## 2019-07-25 NOTE — Progress Notes (Signed)
WH problem visit  [facility]  Subjective:  Deanna KaiserJessica Liptak is a 28 y.o.HF G2P2 nonsmoker being seen today for "rash", pap, Nexplanon removal (inserted 07/11/2016)  Chief Complaint  Patient presents with  . Contraception    wants nexplanon removed  . SEXUALLY TRANSMITTED DISEASE    HPI C/o "rash" on labia x 2 weeks pruritic, sl relieved with Desitin.  Last sex mid August.  Last pap 07/01/2016, with last physical.  LMP >2 years ago  Does the patient have a current or past history of drug use? No   No components found for: HCV]   Health Maintenance Due  Topic Date Due  . HIV Screening  12/13/2005  . TETANUS/TDAP  12/13/2009  . PAP-Cervical Cytology Screening  12/14/2011  . PAP SMEAR-Modifier  12/14/2011  . INFLUENZA VACCINE  06/07/2019    ROS  The following portions of the patient's history were reviewed and updated as appropriate: allergies, current medications, past family history, past medical history, past social history, past surgical history and problem list. Problem list updated.   See flowsheet for other program required questions.  Objective:   Vitals:   07/25/19 1541  BP: 121/81  Weight: 203 lb 3.2 oz (92.2 kg)  Height: 5' (1.524 m)    Physical Exam Constitutional:      Appearance: Normal appearance. She is obese.  Eyes:     Conjunctiva/sclera: Conjunctivae normal.  Pulmonary:     Effort: Pulmonary effort is normal.     Breath sounds: Normal breath sounds.  Abdominal:     Palpations: Abdomen is soft. There is no mass.     Tenderness: There is no abdominal tenderness. There is no guarding.     Hernia: No hernia is present.     Comments: Poor tone, increased adipose, soft without tenderness  Genitourinary:    Exam position: Lithotomy position.     Labia:        Right: Rash (erythematous, pruritic, edematous x 2 wks) present.        Left: Rash present.      Vagina: Vaginal discharge (white thick  leukorrhea) and erythema present.     Cervix: No cervical motion tenderness.     Uterus: Normal.      Adnexa: Left adnexa normal.     Rectum: Normal.     Comments: 3 fissures anterior to anus on perieum, bleeding slightly to HSV culture onset 2 wks ago tender; pt applies Desitin to labia and perineum past 2 weeks due to "rash"--appears to be monilia--to tx today. GC/Chlamydia/Pap/wet mount done Skin:    General: Skin is warm and dry.  Neurological:     Mental Status: She is alert.  Psychiatric:        Mood and Affect: Mood normal.        Behavior: Behavior normal.       Assessment and Plan:  Deanna KaiserJessica Nixon is a 28 y.o. female presenting to the [facility] for a Women's Health problem visit  1. Nexplanon removal Nexplanon Removal Patient identified, informed consent performed, consent signed.   Appropriate time out taken. Nexplanon site identified.  Area prepped in usual sterile fashon. 3 ml of 1% lidocaine with Epinephrine was used to anesthetize the area at the distal end of the implant and along implant site. A small stab incision was made right beside the implant on the distal portion.  The Nexplanon rod was grasped using straight hemostats/manual and removed without difficulty.  There  was minimal blood loss. There were no complications.  Steri-strips were applied over the small incision.  A pressure bandage was applied to reduce any bruising.  The patient tolerated the procedure well and was given post procedure instructions.   Nexplanon:   Counseled patient to take OTC analgesic starting as soon as lidocaine starts to wear off and take regularly for at least 48 hr to decrease discomfort.  Specifically to take with food or milk to decrease stomach upset and for IB 600 mg (3 tablets) every 6 hrs; IB 800 mg (4 tablets) every 8 hrs; or Aleve 2 tablets every 12 hrs.    2. Family planning Pt desires Nexplanon removal and condoms only for birth control despite  counseling Needs CBE 2020 - IGP, rfx Aptima HPV ASCU  3. Screening examination for venereal disease Treat wet mount per standing orders Immunization nurse consult - WET PREP FOR [place], YEAST, CLUE - Virology, [facility] (Pap with reflex)     No follow-ups on file.  No future appointments.  Herbie Saxon, CNM

## 2019-07-25 NOTE — Progress Notes (Signed)
Wet mount reviewed. Per verbal order by Donnal Moat, CNM, pt treated for yeast per standing order. Provider orders completed. MVI given per standing orders.

## 2019-07-25 NOTE — Progress Notes (Signed)
Last physical at [facility] 06/29/2016. Nexplanon insertion at [facility] 07/11/2016. Per [facility] document/notes 07/01/2016 PAP NIL, No HPV test done; Reflex PAP due 2020, CBE due 2020. Desires nexplanon removal today, plans to use condoms.

## 2019-07-30 LAB — IGP, RFX APTIMA HPV ASCU: PAP Smear Comment: 0

## 2019-08-12 NOTE — Addendum Note (Signed)
Addended by: Donnal Moat on: 08/12/2019 01:24 PM   Modules accepted: Orders

## 2020-03-16 IMAGING — CT CT ABD-PELV W/ CM
2 of 4 series · 16 of 46 positions shown, 18 images · IV contrast (APPLIED)
Comparison: None.

CLINICAL DATA: Abdominal pain for 2 weeks

EXAM:
CT ABDOMEN AND PELVIS WITH CONTRAST
TECHNIQUE: Multidetector CT imaging of the abdomen and pelvis was performed
using the standard protocol following bolus administration of
intravenous contrast.
CONTRAST:  100mL OMNIPAQUE IOHEXOL 300 MG/ML  SOLN

[Series 2: axial st · axial · 0.98mm/px · z∈[-668,-213]mm · 13 of 101 slices shown, 15 images]
[im 5/101  soft-tissue]
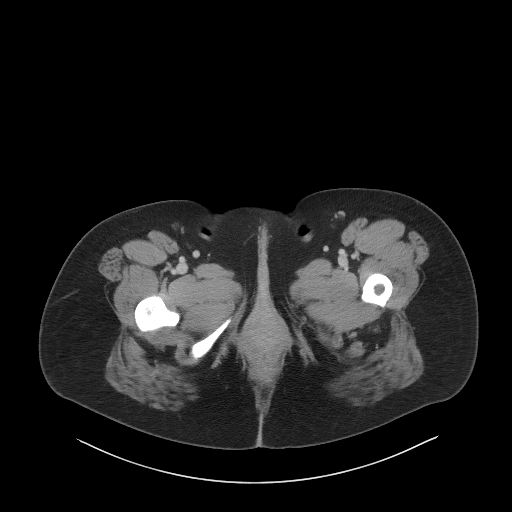
[im 5/101  bone]
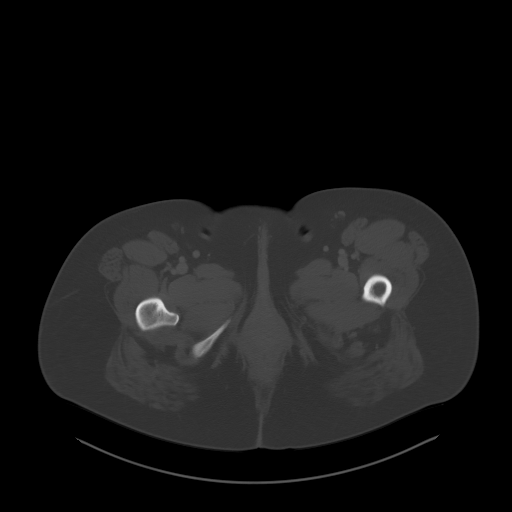
[im 14/101  soft-tissue]
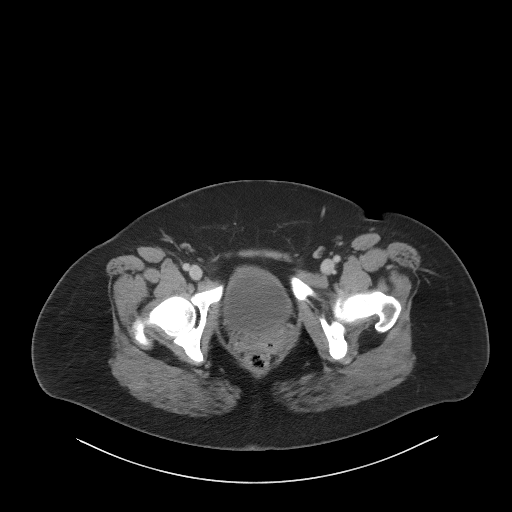
[im 22/101  soft-tissue]
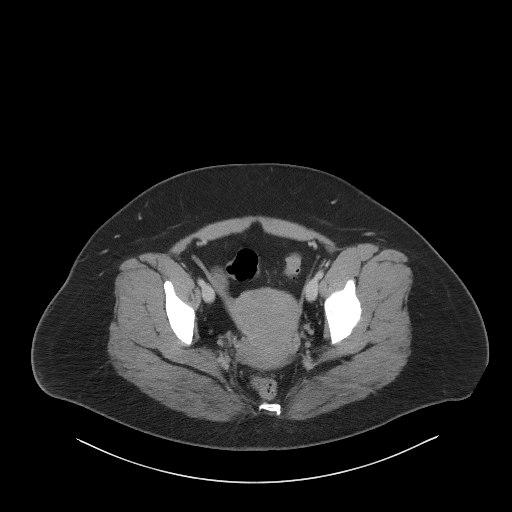
[im 27/101  soft-tissue]
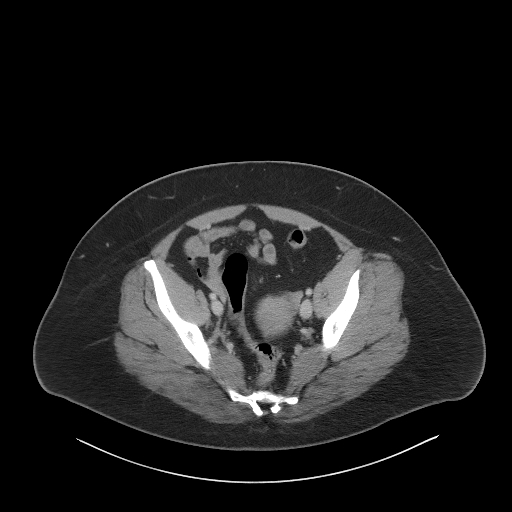
[im 35/101  soft-tissue]
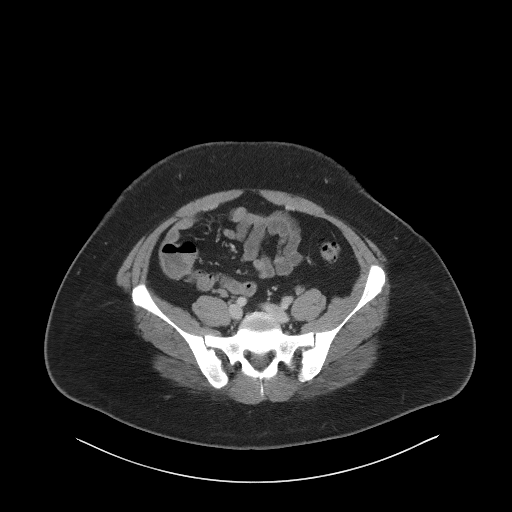
[im 44/101  soft-tissue]
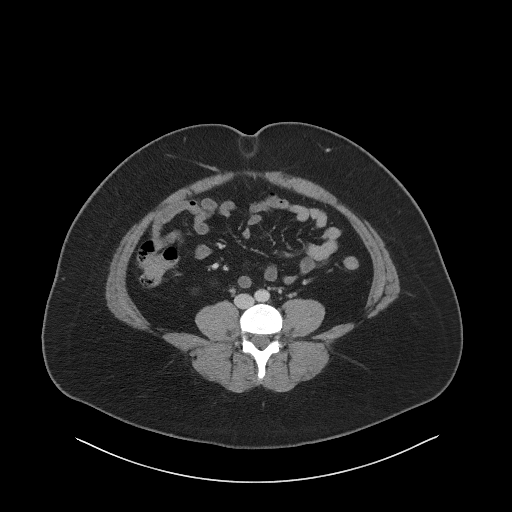
[im 53/101  soft-tissue]
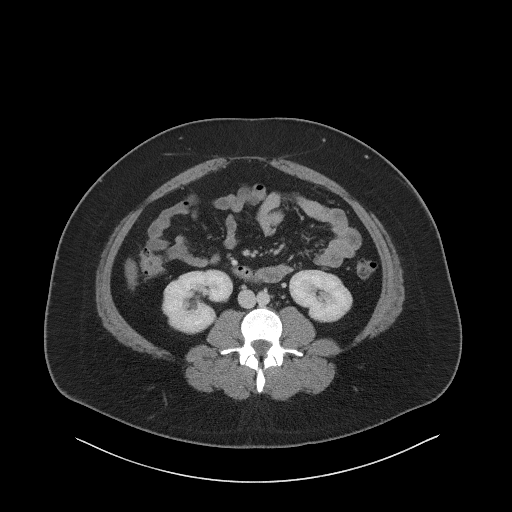
[im 57/101  soft-tissue]
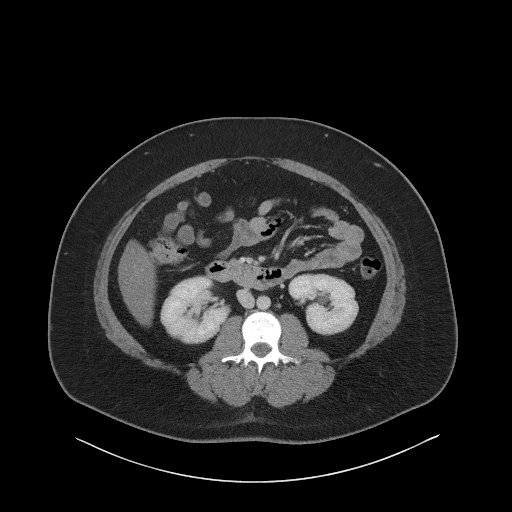
[im 66/101  soft-tissue]
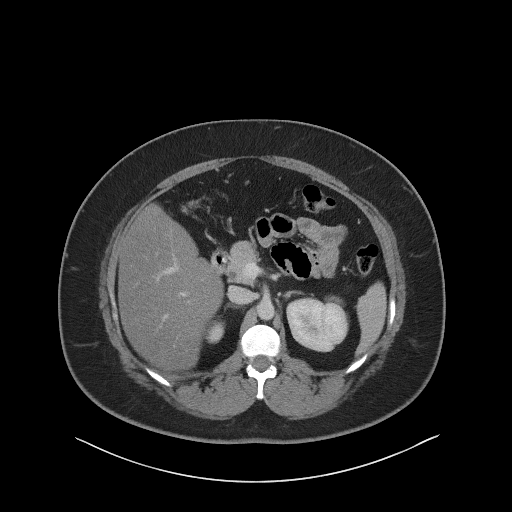
[im 66/101  bone]
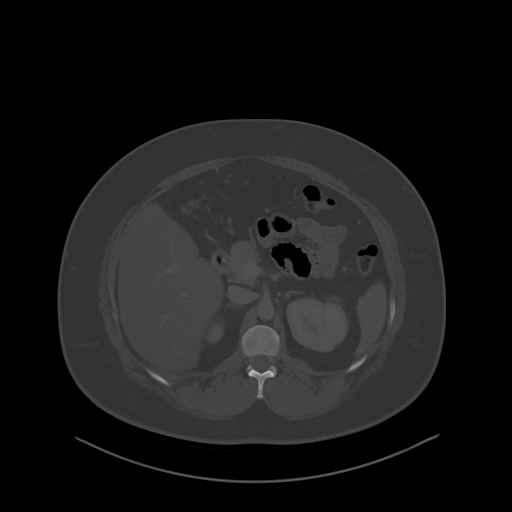
[im 74/101  soft-tissue]
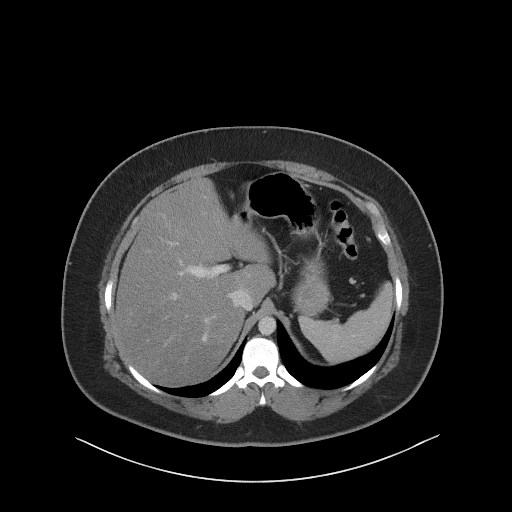
[im 79/101  soft-tissue]
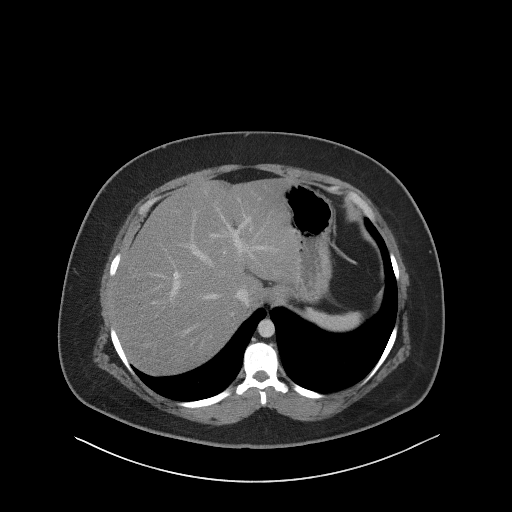
[im 87/101  soft-tissue]
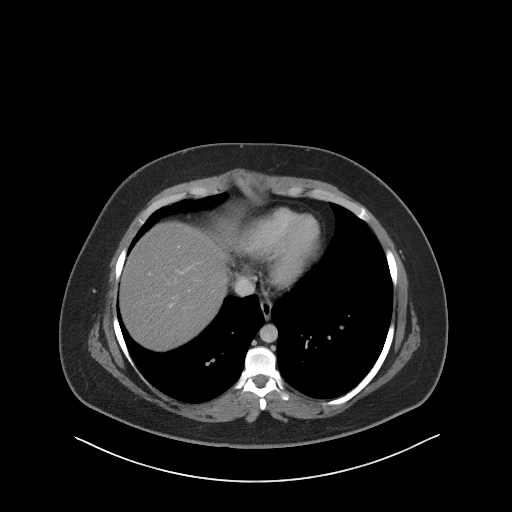
[im 96/101  soft-tissue]
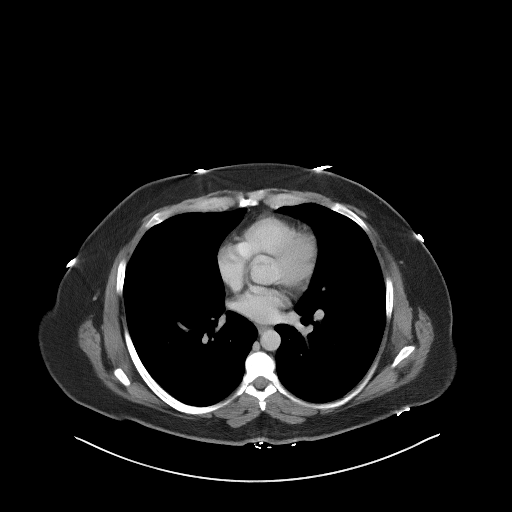

[Series 5: coronal st · coronal · 0.88mm/px · 3 of 104 slices shown]
[im 35/104  soft-tissue]
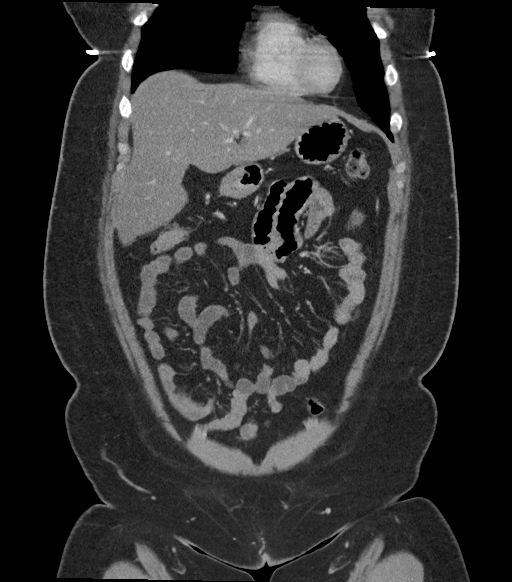
[im 46/104  soft-tissue]
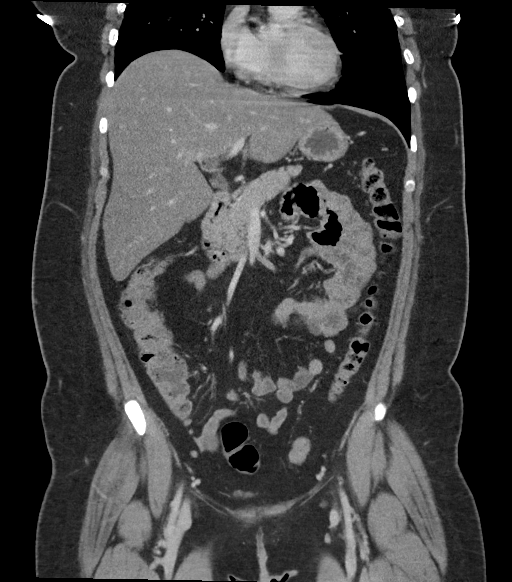
[im 58/104  soft-tissue]
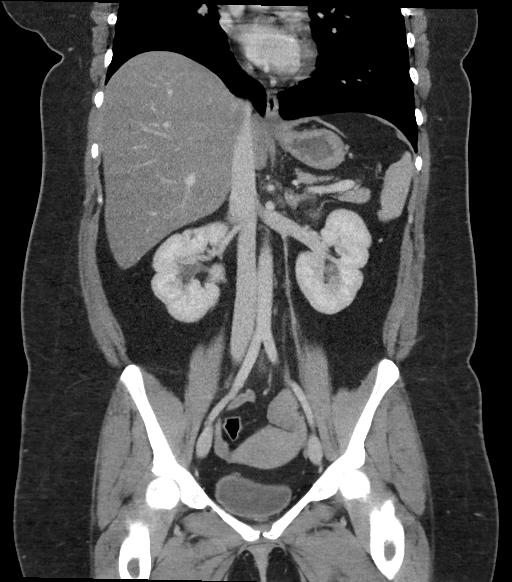

[16 of 46 positions shown; findings below may reference images not displayed]

FINDINGS: LOWER CHEST: There is no basilar pleural or apical pericardial
effusion.

HEPATOBILIARY: Diffuse hypoattenuation of the liver relative to the
spleen suggests hepatic steatosis. No focal liver lesion or biliary
dilatation. The gallbladder is normal.

PANCREAS: The pancreatic parenchymal contours are normal and there
is no ductal dilatation. There is no peripancreatic fluid
collection.

SPLEEN: Normal.

ADRENALS/URINARY TRACT:

--Adrenal glands: Normal.

--Right kidney/ureter: No hydronephrosis, nephroureterolithiasis,
perinephric stranding or solid renal mass.

--Left kidney/ureter: No hydronephrosis, nephroureterolithiasis,
perinephric stranding or solid renal mass.

--Urinary bladder: Normal for degree of distention

STOMACH/BOWEL:

--Stomach/Duodenum: There is no hiatal hernia or other gastric
abnormality. The duodenal course and caliber are normal.

--Small bowel: No dilatation or inflammation.

--Colon: No focal abnormality.

--Appendix: Normal.

VASCULAR/LYMPHATIC: Normal course and caliber of the major abdominal
vessels. No abdominal or pelvic lymphadenopathy.

REPRODUCTIVE: Normal uterus and ovaries.

MUSCULOSKELETAL. No bony spinal canal stenosis or focal osseous
abnormality.

OTHER: None.
IMPRESSION: 1. No acute abdominal or pelvic abnormality.
2. Hepatic steatosis.

## 2020-04-21 ENCOUNTER — Ambulatory Visit (LOCAL_COMMUNITY_HEALTH_CENTER): Payer: 59

## 2020-04-21 ENCOUNTER — Other Ambulatory Visit: Payer: Self-pay

## 2020-04-21 VITALS — BP 128/87 | Ht 60.0 in | Wt 177.0 lb

## 2020-04-21 DIAGNOSIS — Z3202 Encounter for pregnancy test, result negative: Secondary | ICD-10-CM

## 2020-04-21 DIAGNOSIS — Z3009 Encounter for other general counseling and advice on contraception: Secondary | ICD-10-CM

## 2020-04-21 LAB — PREGNANCY, URINE: Preg Test, Ur: NEGATIVE

## 2020-04-21 MED ORDER — MULTIVITAMINS PO CAPS: 1.0000 | ORAL_CAPSULE | Freq: Every day | ORAL | 0 refills | Status: DC

## 2020-04-21 NOTE — Progress Notes (Signed)
Pt interested in Nexplanon, using condoms always, no problems with condoms. Pt scheduled for Nexplanon insertion for 04/30/20 per pt preference. Pt to either abstain from sex or continue to use condoms with all sex.

## 2020-04-30 ENCOUNTER — Ambulatory Visit: Payer: Self-pay

## 2020-09-08 ENCOUNTER — Other Ambulatory Visit: Payer: Self-pay | Admitting: Obstetrics and Gynecology

## 2020-09-08 DIAGNOSIS — N644 Mastodynia: Secondary | ICD-10-CM

## 2020-09-09 ENCOUNTER — Ambulatory Visit: Payer: 59

## 2020-11-04 ENCOUNTER — Ambulatory Visit (LOCAL_COMMUNITY_HEALTH_CENTER): Payer: BC Managed Care – PPO | Admitting: Physician Assistant

## 2020-11-04 ENCOUNTER — Other Ambulatory Visit: Payer: Self-pay

## 2020-11-04 ENCOUNTER — Encounter: Payer: Self-pay | Admitting: Advanced Practice Midwife

## 2020-11-04 ENCOUNTER — Ambulatory Visit: Payer: 59

## 2020-11-04 VITALS — BP 124/84 | Ht <= 58 in | Wt 175.0 lb

## 2020-11-04 DIAGNOSIS — Z30017 Encounter for initial prescription of implantable subdermal contraceptive: Secondary | ICD-10-CM

## 2020-11-04 DIAGNOSIS — Z01419 Encounter for gynecological examination (general) (routine) without abnormal findings: Secondary | ICD-10-CM

## 2020-11-04 DIAGNOSIS — Z3009 Encounter for other general counseling and advice on contraception: Secondary | ICD-10-CM

## 2020-11-04 DIAGNOSIS — Z113 Encounter for screening for infections with a predominantly sexual mode of transmission: Secondary | ICD-10-CM

## 2020-11-04 LAB — PREGNANCY, URINE: Preg Test, Ur: NEGATIVE

## 2020-11-04 MED ORDER — ETONOGESTREL 68 MG ~~LOC~~ IMPL
68.0000 mg | DRUG_IMPLANT | Freq: Once | SUBCUTANEOUS | Status: AC
  Administered 2020-11-04: 68 mg via SUBCUTANEOUS

## 2020-11-04 NOTE — Progress Notes (Signed)
[facility] DEPARTMENT [facility] [address] Main Number: [phone]    Family Planning Visit- Initial Visit  Subjective:  Deanna Nixon is a 29 y.o.  G2P2002   being seen today for an initial well woman visit and to discuss family planning options.  She is currently using Condoms for pregnancy prevention. Patient reports she does not want a pregnancy in the next year.  Patient has the following medical conditions has History of gestational hypertension; History of gestational diabetes; and Obesity, unspecified on their problem list.  Chief Complaint  Patient presents with  . Contraception    PE and Nexplanon insertion    Patient reports that she has had some irregular periods since she had her last Nexplanon removed a little over 1 year ago.  Would like a Nexplanon placed today.  States she has used condoms with all sex for the last 2 weeks, last period was 09/20/2020, and pregnancy test today is negative.  Per chart review, CBE and pap are due today.   Patient denies any concerns.   Body mass index is 37.87 kg/m. - Patient is eligible for diabetes screening based on BMI and age >6?  not applicable HA1C ordered? not applicable  Patient reports 1  partner/s in last year. Desires STI screening?  No - pt declines.  Has patient been screened once for HCV in the past?  No   Lab Results  Component Value Date   HCVAB <0.1 07/17/2019    Does the patient have current drug use (including MJ), have a partner with drug use, and/or has been incarcerated since last result? No  If yes-- Screen for HCV through [facility]   Does the patient meet criteria for HBV testing? No  Criteria:  -Household, sexual or needle sharing contact with HBV -History of drug use -HIV positive -Those with known Hep C   Health Maintenance Due  Topic Date Due  . COVID-19 Vaccine (1) Never done  . TETANUS/TDAP  Never done  . PAP-Cervical Cytology Screening   06/30/2019  . INFLUENZA VACCINE  06/06/2020    Review of Systems  All other systems reviewed and are negative.   The following portions of the patient's history were reviewed and updated as appropriate: allergies, current medications, past family history, past medical history, past social history, past surgical history and problem list. Problem list updated.   See flowsheet for other program required questions.  Objective:   Vitals:   11/04/20 1528  BP: 124/84  Weight: 175 lb (79.4 kg)  Height: 4\' 9"  (1.448 m)    Physical Exam Vitals and nursing note reviewed.  Constitutional:      General: She is not in acute distress.    Appearance: Normal appearance.  HENT:     Head: Normocephalic and atraumatic.     Mouth/Throat:     Mouth: Mucous membranes are moist.     Pharynx: Oropharynx is clear. No oropharyngeal exudate or posterior oropharyngeal erythema.  Eyes:     Conjunctiva/sclera: Conjunctivae normal.  Neck:     Thyroid: No thyroid mass, thyromegaly or thyroid tenderness.  Cardiovascular:     Rate and Rhythm: Normal rate and regular rhythm.  Pulmonary:     Effort: Pulmonary effort is normal.     Breath sounds: Normal breath sounds.  Chest:  Breasts:     Right: Normal. No mass, nipple discharge, skin change, tenderness, axillary adenopathy or supraclavicular adenopathy.     Left: Normal. No mass, nipple  discharge, skin change, tenderness, axillary adenopathy or supraclavicular adenopathy.    Abdominal:     Palpations: Abdomen is soft. There is no mass.     Tenderness: There is no abdominal tenderness. There is no guarding or rebound.  Genitourinary:    General: Normal vulva.     Rectum: Normal.     Comments: External genitalia/pubic area without nits, lice, edema, erythema, lesions and inguinal adenopathy. Vagina with normal mucosa and discharge. Cervix without visible lesions. Uterus firm, mobile, nt, no masses, no CMT, no adnexal tenderness or  fullness. Musculoskeletal:     Cervical back: Neck supple. No tenderness.  Lymphadenopathy:     Cervical: No cervical adenopathy.     Upper Body:     Right upper body: No supraclavicular, axillary or pectoral adenopathy.     Left upper body: No supraclavicular, axillary or pectoral adenopathy.  Skin:    General: Skin is warm and dry.     Findings: No bruising, erythema, lesion or rash.  Neurological:     Mental Status: She is alert and oriented to person, place, and time.  Psychiatric:        Mood and Affect: Mood normal.        Behavior: Behavior normal.        Thought Content: Thought content normal.        Judgment: Judgment normal.       Assessment and Plan:  Deanna Nixon is a 29 y.o. female presenting to the [facility] Department for an initial well woman exam/family planning visit  Contraception counseling: Reviewed all forms of birth control options in the tiered based approach. available including abstinence; over the counter/barrier methods; hormonal contraceptive medication including pill, patch, ring, injection,contraceptive implant, ECP; hormonal and nonhormonal IUDs; permanent sterilization options including vasectomy and the various tubal sterilization modalities. Risks, benefits, and typical effectiveness rates were reviewed.  Questions were answered.  Written information was also given to the patient to review.  Patient desires Nexplanon, this was prescribed for patient. She will follow up in  1 year and prn for surveillance.  She was told to call with any further questions, or with any concerns about this method of contraception.  Emphasized use of condoms 100% of the time for STI prevention.  Patient was not a candidate for ECP today.   1. Encounter for counseling regarding contraception Reviewed with patient normal SE of Nexplanon and when to call clinic for irregular bleeding or other concerns. Counseled patient to use condoms as back up for 10 days  after insertion. Pregnancy test today is negative, so will place Nexplanon today. - Pregnancy, urine  2. Well woman exam with routine gynecological exam Reviewed with patient healthy habits to maintain general health. Enc MVI 1 po daily. Enc to establish with/ follow up with PCP for primary care concerns, age appropriate screenings and illness. Await pap results.  Counseled that RN will call or send letter once results are back. - IGP, rfx Aptima HPV ASCU  3. Screening for STD (sexually transmitted disease Await test results.  Counseled that RN will call if needs to RTC for treatment once results are back.  - HIV/HCV [facility] - Syphilis Serology, [facility]  4. Nexplanon insertion Nexplanon Insertion Procedure Patient identified, informed consent performed, consent signed.   Patient does understand that irregular bleeding is a very common side effect of this medication. She was advised to have backup contraception after placement. Patient was determined to meet WHO criteria for  not being pregnant. Appropriate time out taken.  The insertion site was identified 8-10 cm (3-4 inches) from the medial epicondyle of the humerus and 3-5 cm (1.25-2 inches) posterior to (below) the sulcus (groove) between the biceps and triceps muscles of the patient's left arm and marked.  Patient was prepped with alcohol swab and then injected with 3 ml of 1% lidocaine.  Arm was prepped with chlorhexidene, Nexplanon removed from packaging,  Device confirmed in needle, then inserted full length of needle and withdrawn per handbook instructions. Nexplanon was able to palpated in the patient's arm; patient palpated the insert herself. There was minimal blood loss.  Patient insertion site covered with guaze and a pressure bandage to reduce any bruising.  The patient tolerated the procedure well and was given post procedure instructions.   Nexplanon:   Counseled patient to take OTC analgesic starting as soon as  lidocaine starts to wear off and take regularly for at least 48 hr to decrease discomfort.  Specifically to take with food or milk to decrease stomach upset and for IB 600 mg (3 tablets) every 6 hrs; IB 800 mg (4 tablets) every 8 hrs; or Aleve 2 tablets every 12 hrs.   - etonogestrel (NEXPLANON) implant 68 mg     Return in about 1 year (around 11/04/2021) for RP and prn.  No future appointments.  Matt Holmes, PA

## 2020-11-04 NOTE — Progress Notes (Signed)
Patient here for physical and nexplanon. Pap due.   Harvie Heck, RN

## 2020-11-09 LAB — IGP, RFX APTIMA HPV ASCU: PAP Smear Comment: 0

## 2020-11-11 LAB — HM HIV SCREENING LAB: HM HIV Screening: NEGATIVE

## 2020-11-11 LAB — HM HEPATITIS C SCREENING LAB: HM Hepatitis Screen: NEGATIVE

## 2020-11-14 ENCOUNTER — Encounter: Payer: Self-pay | Admitting: Student

## 2020-11-23 ENCOUNTER — Telehealth: Payer: Self-pay | Admitting: Family Medicine

## 2020-11-23 NOTE — Telephone Encounter (Signed)
Patient has question about her [facility] and her menstrual period.

## 2020-11-23 NOTE — Telephone Encounter (Signed)
Call to patient, patient reports being on her period since nexplanon insertion on 11/04/2020. Patient reports the bleeding is lighter now. RN counseled patient that she may have irregular bleeding for up to 3 months after nexplanon insertion. RN reminded patient she can take the 600 mg ibuprofen with food as the provider instructed on 11/04/2020. Patient reports she will try that and give [place] a call back if it does not work.   Harvie Heck, RN

## 2021-01-10 ENCOUNTER — Ambulatory Visit: Payer: BC Managed Care – PPO | Admitting: Advanced Practice Midwife

## 2021-01-10 ENCOUNTER — Encounter: Payer: Self-pay | Admitting: Advanced Practice Midwife

## 2021-01-10 ENCOUNTER — Other Ambulatory Visit: Payer: Self-pay

## 2021-01-10 DIAGNOSIS — Z113 Encounter for screening for infections with a predominantly sexual mode of transmission: Secondary | ICD-10-CM

## 2021-01-10 DIAGNOSIS — B379 Candidiasis, unspecified: Secondary | ICD-10-CM

## 2021-01-10 LAB — WET PREP FOR TRICH, YEAST, CLUE: Trichomonas Exam: NEGATIVE

## 2021-01-10 MED ORDER — CLOTRIMAZOLE 1 % VA CREA: 1.0000 | TOPICAL_CREAM | Freq: Every day | VAGINAL | 0 refills | Status: AC

## 2021-01-10 NOTE — Addendum Note (Signed)
Addended by: Burt Knack on: 01/10/2021 11:34 AM   Modules accepted: Orders

## 2021-01-10 NOTE — Progress Notes (Signed)
Patient here for STD testing. States she has been having some discharge she wants checked.Burt Knack, RN

## 2021-01-10 NOTE — Progress Notes (Signed)
Wet mount reviewed, patient treated for yeast per SO..Keelyn Monjaras Brewer-Jensen, RN 

## 2021-01-10 NOTE — Progress Notes (Signed)
[facility] Department STI clinic/screening visit  Subjective:  Deanna Nixon is a 30 y.o. SHF nonsmoker G2P2 female being seen today for an STI screening visit. The patient reports they do not have symptoms.  Patient reports that they do not desire a pregnancy in the next year.   They reported they are not interested in discussing contraception today.  Patient's last menstrual period was 12/29/2020 (exact date).   Patient has the following medical conditions:   Patient Active Problem List   Diagnosis Date Noted  . History of gestational hypertension 06/29/2016  . History of gestational diabetes 06/29/2016  . Obesity, unspecified 07/17/2014    Chief Complaint  Patient presents with  . Exposure to STD    HPI  Patient reports LMP 12/30/31.  Last sex 01/07/21 without condom; with current partner x 2 years.  Last ETOH 01/08/21 (1 glass wine) 1x/yr. No sxs  Last HIV test per patient/review of record was 11/11/20 Patient reports last pap was 11/04/20 neg  See flowsheet for further details and programmatic requirements.    The following portions of the patient's history were reviewed and updated as appropriate: allergies, current medications, past medical history, past social history, past surgical history and problem list.  Objective:  There were no vitals filed for this visit.  Physical Exam Vitals and nursing note reviewed.  Constitutional:      Appearance: Normal appearance. She is obese.  HENT:     Head: Normocephalic and atraumatic.     Mouth/Throat:     Mouth: Mucous membranes are moist.     Pharynx: Oropharynx is clear. No oropharyngeal exudate or posterior oropharyngeal erythema.  Eyes:     Conjunctiva/sclera: Conjunctivae normal.  Pulmonary:     Effort: Pulmonary effort is normal.  Chest:  Breasts:     Right: No axillary adenopathy or supraclavicular adenopathy.     Left: No axillary adenopathy or supraclavicular adenopathy.    Abdominal:      Palpations: Abdomen is soft. There is no mass.     Tenderness: There is no abdominal tenderness. There is no rebound.     Comments: Soft without masses or tenderness Poor tone  Genitourinary:    General: Normal vulva.     Exam position: Lithotomy position.     Pubic Area: No rash or pubic lice.      Labia:        Right: No rash or lesion.        Left: No rash or lesion.      Vagina: Normal. No vaginal discharge (large amt white creamy leukorrhea, ph<4.5), erythema, bleeding or lesions.     Cervix: Normal.     Uterus: Normal.      Adnexa: Right adnexa normal and left adnexa normal.     Rectum: Normal.  Lymphadenopathy:     Head:     Right side of head: No preauricular or posterior auricular adenopathy.     Left side of head: No preauricular or posterior auricular adenopathy.     Cervical: No cervical adenopathy.     Upper Body:     Right upper body: No supraclavicular or axillary adenopathy.     Left upper body: No supraclavicular or axillary adenopathy.     Lower Body: No right inguinal adenopathy. No left inguinal adenopathy.  Skin:    General: Skin is warm and dry.     Findings: No rash.  Neurological:     Mental Status: She is alert and oriented to person, place,  and time.      Assessment and Plan:  [name] is a 30 y.o. female presenting to the [facility] Department for STI screening  1. Screening examination for venereal disease Treat wet mount per standing orders Immunization nurse consult     No follow-ups on file.  No future appointments.  Alberteen Spindle, CNM

## 2021-02-09 ENCOUNTER — Encounter: Payer: Self-pay | Admitting: Otolaryngology

## 2021-02-09 ENCOUNTER — Other Ambulatory Visit: Payer: Self-pay

## 2021-02-11 ENCOUNTER — Other Ambulatory Visit
Admission: RE | Admit: 2021-02-11 | Discharge: 2021-02-11 | Disposition: A | Discharge status: Home / Self Care | Payer: BC Managed Care – PPO | Source: Ambulatory Visit | Attending: Otolaryngology | Admitting: Otolaryngology

## 2021-02-11 ENCOUNTER — Other Ambulatory Visit: Payer: Self-pay

## 2021-02-11 DIAGNOSIS — Z01812 Encounter for preprocedural laboratory examination: Secondary | ICD-10-CM | POA: Insufficient documentation

## 2021-02-11 DIAGNOSIS — Z20822 Contact with and (suspected) exposure to covid-19: Secondary | ICD-10-CM | POA: Insufficient documentation

## 2021-02-11 LAB — SARS CORONAVIRUS 2 (TAT 6-24 HRS): SARS Coronavirus 2: NEGATIVE

## 2021-02-15 ENCOUNTER — Other Ambulatory Visit: Payer: Self-pay

## 2021-02-15 ENCOUNTER — Ambulatory Visit
Admission: RE | Admit: 2021-02-15 | Discharge: 2021-02-15 | Disposition: A | Discharge status: Home / Self Care | Payer: BC Managed Care – PPO | Attending: Otolaryngology | Admitting: Otolaryngology

## 2021-02-15 ENCOUNTER — Ambulatory Visit: Payer: BC Managed Care – PPO | Admitting: Anesthesiology

## 2021-02-15 ENCOUNTER — Encounter: Payer: Self-pay | Admitting: Otolaryngology

## 2021-02-15 ENCOUNTER — Encounter
Admission: RE | Disposition: A | Discharge status: Home / Self Care | Payer: Self-pay | Source: Home / Self Care | Attending: Otolaryngology

## 2021-02-15 DIAGNOSIS — J342 Deviated nasal septum: Secondary | ICD-10-CM | POA: Insufficient documentation

## 2021-02-15 DIAGNOSIS — S022XXA Fracture of nasal bones, initial encounter for closed fracture: Secondary | ICD-10-CM | POA: Insufficient documentation

## 2021-02-15 HISTORY — PX: CLOSED REDUCTION NASAL FRACTURE: SHX5365

## 2021-02-15 HISTORY — PX: SEPTOPLASTY: SHX2393

## 2021-02-15 LAB — POCT PREGNANCY, URINE: Preg Test, Ur: NEGATIVE

## 2021-02-15 SURGERY — SEPTOPLASTY, NOSE
Anesthesia: General | Site: Nose

## 2021-02-15 MED ORDER — MIDAZOLAM HCL 5 MG/5ML IJ SOLN
INTRAMUSCULAR | Status: DC | PRN
  Administered 2021-02-15: 2 mg via INTRAVENOUS

## 2021-02-15 MED ORDER — SCOPOLAMINE 1 MG/3DAYS TD PT72
1.0000 | MEDICATED_PATCH | Freq: Once | TRANSDERMAL | Status: DC
  Administered 2021-02-15: 1.5 mg via TRANSDERMAL

## 2021-02-15 MED ORDER — ACETAMINOPHEN 10 MG/ML IV SOLN
1000.0000 mg | Freq: Once | INTRAVENOUS | Status: AC
  Administered 2021-02-15: 1000 mg via INTRAVENOUS

## 2021-02-15 MED ORDER — OXYCODONE HCL 5 MG PO TABS
5.0000 mg | ORAL_TABLET | Freq: Once | ORAL | Status: AC | PRN
  Administered 2021-02-15: 5 mg via ORAL

## 2021-02-15 MED ORDER — PROPOFOL 10 MG/ML IV BOLUS
INTRAVENOUS | Status: DC | PRN
  Administered 2021-02-15: 150 mg via INTRAVENOUS

## 2021-02-15 MED ORDER — LIDOCAINE-EPINEPHRINE 1 %-1:100000 IJ SOLN
INTRAMUSCULAR | Status: DC | PRN
  Administered 2021-02-15: 5 mL

## 2021-02-15 MED ORDER — FENTANYL CITRATE (PF) 100 MCG/2ML IJ SOLN
25.0000 ug | INTRAMUSCULAR | Status: DC | PRN
  Administered 2021-02-15 (×2): 50 ug via INTRAVENOUS

## 2021-02-15 MED ORDER — ONDANSETRON HCL 4 MG/2ML IJ SOLN
INTRAMUSCULAR | Status: DC | PRN
  Administered 2021-02-15: 4 mg via INTRAVENOUS

## 2021-02-15 MED ORDER — LIDOCAINE HCL (CARDIAC) PF 100 MG/5ML IV SOSY
PREFILLED_SYRINGE | INTRAVENOUS | Status: DC | PRN
  Administered 2021-02-15: 50 mg via INTRAVENOUS

## 2021-02-15 MED ORDER — LACTATED RINGERS IV SOLN: INTRAVENOUS | Status: DC

## 2021-02-15 MED ORDER — HYDROCODONE-ACETAMINOPHEN 5-325 MG PO TABS: 1.0000 | ORAL_TABLET | Freq: Four times a day (QID) | ORAL | 0 refills | Status: DC | PRN

## 2021-02-15 MED ORDER — DOXYCYCLINE HYCLATE 100 MG PO CAPS: ORAL_CAPSULE | ORAL | 0 refills | Status: DC

## 2021-02-15 MED ORDER — DEXAMETHASONE SODIUM PHOSPHATE 4 MG/ML IJ SOLN
INTRAMUSCULAR | Status: DC | PRN
  Administered 2021-02-15: 8 mg via INTRAVENOUS

## 2021-02-15 MED ORDER — ONDANSETRON HCL 4 MG/2ML IJ SOLN: 4.0000 mg | Freq: Once | INTRAMUSCULAR | Status: DC | PRN

## 2021-02-15 MED ORDER — SUCCINYLCHOLINE CHLORIDE 20 MG/ML IJ SOLN
INTRAMUSCULAR | Status: DC | PRN
  Administered 2021-02-15: 80 mg via INTRAVENOUS

## 2021-02-15 MED ORDER — OXYMETAZOLINE HCL 0.05 % NA SOLN
NASAL | Status: DC | PRN
  Administered 2021-02-15: 1 via TOPICAL

## 2021-02-15 MED ORDER — OXYCODONE HCL 5 MG/5ML PO SOLN: 5.0000 mg | Freq: Once | ORAL | Status: AC | PRN

## 2021-02-15 MED ORDER — FENTANYL CITRATE (PF) 100 MCG/2ML IJ SOLN
INTRAMUSCULAR | Status: DC | PRN
  Administered 2021-02-15: 50 ug via INTRAVENOUS

## 2021-02-15 MED ORDER — GLYCOPYRROLATE 0.2 MG/ML IJ SOLN
INTRAMUSCULAR | Status: DC | PRN
  Administered 2021-02-15: .1 mg via INTRAVENOUS

## 2021-02-15 SURGICAL SUPPLY — 23 items
ADH LQ OCL WTPRF AMP STRL LF (MISCELLANEOUS) ×1
ADHESIVE MASTISOL STRL (MISCELLANEOUS) ×2 IMPLANT
CANISTER SUCT 1200ML W/VALVE (MISCELLANEOUS) ×2 IMPLANT
COVER WAND RF STERILE (DRAPES) ×2 IMPLANT
ELECT REM PT RETURN 9FT ADLT (ELECTROSURGICAL) ×2
ELECTRODE REM PT RTRN 9FT ADLT (ELECTROSURGICAL) ×1 IMPLANT
GLOVE SURG ENC MOIS LTX SZ7.5 (GLOVE) ×3 IMPLANT
GOWN STRL REUS W/ TWL LRG LVL3 (GOWN DISPOSABLE) ×1 IMPLANT
GOWN STRL REUS W/TWL LRG LVL3 (GOWN DISPOSABLE) ×2
KIT TURNOVER KIT A (KITS) ×2 IMPLANT
NDL HYPO 25GX1X1/2 BEV (NEEDLE) ×1 IMPLANT
NEEDLE HYPO 25GX1X1/2 BEV (NEEDLE) ×2 IMPLANT
NS IRRIG 500ML POUR BTL (IV SOLUTION) ×2 IMPLANT
PACK ENT CUSTOM (PACKS) ×1 IMPLANT
PATTIES SURGICAL .5 X3 (DISPOSABLE) ×1 IMPLANT
SPLINT NASAL SEPTAL PRE-CUT (MISCELLANEOUS) ×2 IMPLANT
STRIP CLOSURE SKIN 1/2X4 (GAUZE/BANDAGES/DRESSINGS) ×2 IMPLANT
SUT CHROMIC 4 0 RB 1X27 (SUTURE) ×2 IMPLANT
SUT ETHILON 4-0 (SUTURE) ×2
SUT ETHILON 4-0 FS2 18XMFL BLK (SUTURE) ×1
SUT PLAIN GUT 4-0 (SUTURE) ×2 IMPLANT
SUTURE ETHLN 4-0 FS2 18XMF BLK (SUTURE) ×1 IMPLANT
SYR 10ML LL (SYRINGE) ×2 IMPLANT

## 2021-02-15 NOTE — Op Note (Signed)
02/15/2021 10:42 AM  Deanna Nixon [id number]   Pre-Op Dx: NASAL FRACTURE  Post-op Dx: SAME  Procedure: 1) Closed reduction of nasal fracture 2) Nasal Septoplasty  Surgeon: Sandi Mealy., MD  Anesthesia: General Endotracheal   EBL: Minimal   Complications: None   Findings: Nasal dorsum deviated to the right with bilateral depressed nasal bone fracture. Septum deviated to the right anteriorly with left septal spur and fracture of the anterior dorsal septal cartilage  Procedure: After the patient was identified in holding and the history and physical and consent was reviewed, the patient was taken to the operating room and placed in a supine position. General endotracheal anesthesia was induced in the normal fashion. The patient was draped with the eyes protected. The nose was decongested with Afrin moistened pledgets. 1% lidocaine with epinephrine 1:100,000 was injected into the nasal setum and lateral nasal wall.   A Boies elevator was then placed intranasally, and used to manipulate the nasal bone fragments until the nasal dorsum appeared to be midline with no palpable step-off deformity. The septum could not be adequately straightened with closed reduction.   Beginning on the left hand side a hemitransfixion incision was then created on the leading edge of the septum on the left.  A subperichondrial plane was elevated posteriorly on the left and taken back to the perpendicular plate of the ethmoid where subperiosteal plane was elevated posteriorly on the left. A large septal spur was identified on the left hand side.  An inferior rim of redundant septal cartilage was removed from where it deviated over the maxillary crest. The perpendicular plate of the ethmoid was separated from the quadrangular cartilage, and a subperiosteal plane elevated on the right of the bony septum. The large septal spur was removed, dividing the septal bone superiorly with Knight scissors, and inferiorly from  the maxillary crest with a chisel.    The anterior cartilaginous septum remained deviated so the perichondrium was elevated on the right as well to better free this up. The dorsal cartilage was noted to becrushed by the injury. A small segment of mid septal cartilage was harvested and used to bolster the dorsal septum for support. The left side of the anterior septal cartilage was scored with a 15 blade to allow its curvature to straighten more. The septum was then replaced in the midline. Reinspection through each nostril showed excellent reduction of the septal deformity.4-0 plain gut suture was used to tack the cartilage graft in place along the dorsal anterior septum. The septal incision was closed with 4-0 chromic gut suture. A 4-0 plain gut suture was used to reapproximate the septal flaps to the underlying cartilage, utilizing a running, quilting type stitch.   With the septoplasty completed and no active bleeding, nasal septal splints were placed within each nostril and affixed to the septum using a 3-0 nylon suture. Following this, the skin was prepped with Mastisol, and 1/2 Ster-strips applied to the nose. Next an Aquaplast splint was fashioned to fit the nasal dorsum, and placed for protection of the nasal bones during healing. This was further secured with Steri-strips.   The patient was then returned to the anesthesiologist for awakening, and was taken to the Recovery Room in stable condition.  Disposition:   PACU to home  Plan: Take pain medications and antibiotics as prescribed. No strenuous activity for 2 weeks. Ice to nasal dorsum as needed for swelling. May rinse nose with saline 3 times daily to clear secretions. Keep external splint in place  until follow-up.  Follow-up in 1 week for splint removal.   Sandi Mealy 02/15/2021 10:43 AM

## 2021-02-15 NOTE — Anesthesia Procedure Notes (Signed)
Procedure Name: Intubation Date/Time: 02/15/2021 9:29 AM Performed by: Jimmy Picket, CRNA Pre-anesthesia Checklist: Patient identified, Emergency Drugs available, Suction available, Patient being monitored and Timeout performed Patient Re-evaluated:Patient Re-evaluated prior to induction Oxygen Delivery Method: Circle system utilized Preoxygenation: Pre-oxygenation with 100% oxygen Induction Type: IV induction Ventilation: Mask ventilation without difficulty Laryngoscope Size: Miller and 2 Grade View: Grade I Tube type: Oral Rae Tube size: 7.0 mm Number of attempts: 1 Placement Confirmation: ETT inserted through vocal cords under direct vision,  positive ETCO2 and breath sounds checked- equal and bilateral Tube secured with: Tape Dental Injury: Teeth and Oropharynx as per pre-operative assessment

## 2021-02-15 NOTE — H&P (Signed)
History and physical reviewed and will be scanned in later. No change in medical status reported by the patient or family, appears stable for surgery. All questions regarding the procedure answered, and patient (or family if a child) expressed understanding of the procedure. ? ?Deanna Nixon S Deanna Nixon ?@TODAY@ ?

## 2021-02-15 NOTE — Transfer of Care (Signed)
Immediate Anesthesia Transfer of Care Note  Patient: Deanna Nixon  Procedure(s) Performed: SEPTOPLASTY (N/A Nose) CLOSED REDUCTION NASAL FRACTURE (N/A Nose)  Patient Location: PACU  Anesthesia Type: General ETT  Level of Consciousness: awake, alert  and patient cooperative  Airway and Oxygen Therapy: Patient Spontanous Breathing and Patient connected to supplemental oxygen  Post-op Assessment: Post-op Vital signs reviewed, Patient's Cardiovascular Status Stable, Respiratory Function Stable, Patent Airway and No signs of Nausea or vomiting  Post-op Vital Signs: Reviewed and stable  Complications: No complications documented.

## 2021-02-15 NOTE — Anesthesia Postprocedure Evaluation (Signed)
Anesthesia Post Note  Patient: Deanna Nixon  Procedure(s) Performed: SEPTOPLASTY (N/A Nose) CLOSED REDUCTION NASAL FRACTURE (N/A Nose)     Patient location during evaluation: PACU Anesthesia Type: General Level of consciousness: awake and alert and oriented Pain management: satisfactory to patient Vital Signs Assessment: post-procedure vital signs reviewed and stable Respiratory status: spontaneous breathing, nonlabored ventilation and respiratory function stable Cardiovascular status: blood pressure returned to baseline and stable Postop Assessment: Adequate PO intake and No signs of nausea or vomiting Anesthetic complications: no   No complications documented.  Cherly Beach

## 2021-02-15 NOTE — Discharge Instructions (Signed)
[place] [place] END[facility]  What is Functional Endoscopic Sinus Surgery?  The Surgery involves making the natural openings of the sinuses larger by removing the bony partitions that separate the sinuses from the nasal cavity.  The natural sinus lining is preserved as much as possible to allow the sinuses to resume normal function after the surgery.  In some patients nasal polyps (excessively swollen lining of the sinuses) may be removed to relieve obstruction of the sinus openings.  The surgery is performed through the nose using lighted scopes, which eliminates the need for incisions on the face.  A septoplasty is a different procedure which is sometimes performed with sinus surgery.  It involves straightening the boy partition that separates the two sides of your nose.  A crooked or deviated septum may need repair if is obstructing the sinuses or nasal airflow.  Turbinate reduction is also often performed during sinus surgery.  The turbinates are bony proturberances from the side walls of the nose which swell and can obstruct the nose in patients with sinus and allergy problems.  Their size can be surgically reduced to help relieve nasal obstruction.  What Can Sinus Surgery Do For Me?  Sinus surgery can reduce the frequency of sinus infections requiring antibiotic treatment.  This can provide improvement in nasal congestion, post-nasal drainage, facial pressure and nasal obstruction.  Surgery will NOT prevent you from ever having an infection again, so it usually only for patients who get infections 4 or more times yearly requiring antibiotics, or for infections that do not clear with antibiotics.  It will not cure nasal allergies, so patients with allergies may still require medication to treat their allergies after surgery. Surgery may improve headaches related to sinusitis, however, some people will continue to  require medication to control sinus headaches related to allergies.  Surgery will do nothing for other forms of headache (migraine, tension or cluster).  What Are the Risks of Endoscopic Sinus Surgery?  Current techniques allow surgery to be performed safely with little risk, however, there are rare complications that patients should be aware of.  Because the sinuses are located around the eyes, there is risk of eye injury, including blindness, though again, this would be quite rare. This is usually a result of bleeding behind the eye during surgery, which puts the vision oat risk, though there are treatments to protect the vision and prevent permanent disrupted by surgery causing a leak of the spinal fluid that surrounds the brain.  More serious complications would include bleeding inside the brain cavity or damage to the brain.  Again, all of these complications are uncommon, and spinal fluid leaks can be safely managed surgically if they occur.  The most common complication of sinus surgery is bleeding from the nose, which may require packing or cauterization of the nose.  Continued sinus have polyps may experience recurrence of the polyps requiring revision surgery.  Alterations of sense of smell or injury to the tear ducts are also rare complications.   What is the Surgery Like, and what is the Recovery?  The Surgery usually takes a couple of hours to perform, and is usually performed under a general anesthetic (completely asleep).  Patients are usually discharged home after a couple of hours.  Sometimes during surgery it is necessary to pack the nose to control bleeding, and the packing is left in place for 24 - 48 hours, and removed by your surgeon.  If a septoplasty was performed during the procedure, there is often a splint placed which must be removed after 5-7 days.   Discomfort: Pain is usually mild to moderate, and can be controlled by prescription pain medication or acetaminophen (Tylenol).   Aspirin, Ibuprofen (Advil, Motrin), or Naprosyn (Aleve) should be avoided, as they can cause increased bleeding.  Most patients feel sinus pressure like they have a bad head cold for several days.  Sleeping with your head elevated can help reduce swelling and facial pressure, as can ice packs over the face.  A humidifier may be helpful to keep the mucous and blood from drying in the nose.   Diet: There are no specific diet restrictions, however, you should generally start with clear liquids and a light diet of bland foods because the anesthetic can cause some nausea.  Advance your diet depending on how your stomach feels.  Taking your pain medication with food will often help reduce stomach upset which pain medications can cause.  Nasal Saline Irrigation: It is important to remove blood clots and dried mucous from the nose as it is healing.  This is done by having you irrigate the nose at least 3 - 4 times daily with a salt water solution.  We recommend using NeilMed Sinus Rinse (available at the drug store).  Fill the squeeze bottle with the solution, bend over a sink, and insert the tip of the squeeze bottle into the nose  of an inch.  Point the tip of the squeeze bottle towards the inside corner of the eye on the same side your irrigating.  Squeeze the bottle and gently irrigate the nose.  If you bend forward as you do this, most of the fluid will flow back out of the nose, instead of down your throat.   The solution should be warm, near body temperature, when you irrigate.   Each time you irrigate, you should use a full squeeze bottle.   Note that if you are instructed to use Nasal Steroid Sprays at any time after your surgery, irrigate with saline BEFORE using the steroid spray, so you do not wash it all out of the nose. Another product, Nasal Saline Gel (such as AYR Nasal Saline Gel) can be applied in each nostril 3 - 4 times daily to moisture the nose and reduce scabbing or crusting.  Bleeding:   Bloody drainage from the nose can be expected for several days, and patients are instructed to irrigate their nose frequently with salt water to help remove mucous and blood clots.  The drainage may be dark red or brown, though some fresh blood may be seen intermittently, especially after irrigation.  Do not blow you nose, as bleeding may occur. If you must sneeze, keep your mouth open to allow air to escape through your mouth.  If heavy bleeding occurs: Irrigate the nose with saline to rinse out clots, then spray the nose 3 - 4 times with Afrin Nasal Decongestant Spray.  The spray will constrict the blood vessels to slow bleeding.  Pinch the lower half of your nose shut to apply pressure, and lay down with your head elevated.  Ice packs over the nose may help as well. If bleeding persists despite these measures, you should notify your doctor.  Do not use the Afrin routinely to control nasal congestion after surgery, as it can result in worsening congestion and may affect healing.     Activity: Return to work varies among patients. Most patients will be   out of work at least 5 - 7 days to recover.  Patient may return to work after they are off of narcotic pain medication, and feeling well enough to perform the functions of their job.  Patients must avoid heavy lifting (over 10 pounds) or strenuous physical for 2 weeks after surgery, so your employer may need to assign you to light duty, or keep you out of work longer if light duty is not possible.  NOTE: you should not drive, operate dangerous machinery, do any mentally demanding tasks or make any important legal or financial decisions while on narcotic pain medication and recovering from the general anesthetic.    Call Your Doctor Immediately if You Have Any of the Following: 1. Bleeding that you cannot control with the above measures 2. Loss of vision, double vision, bulging of the eye or black eyes. 3. Fever over 101 degrees 4. Neck stiffness with  severe headache, fever, nausea and change in mental state. You are always encourage to call anytime with concerns, however, please call with requests for pain medication refills during office hours.  Office Endoscopy: During follow-up visits your doctor will remove any packing or splints that may have been placed and evaluate and clean your sinuses endoscopically.  Topical anesthetic will be used to make this as comfortable as possible, though you may want to take your pain medication prior to the visit.  How often this will need to be done varies from patient to patient.  After complete recovery from the surgery, you may need follow-up endoscopy from time to time, particularly if there is concern of recurrent infection or nasal polyps.  Scopolamine skin patches REMOVE PATCH IN 72 HOURS AND WASH HANDS IMMEDIATELY What is this medicine? SCOPOLAMINE (skoe POL a meen) is used to prevent nausea and vomiting caused by motion sickness, anesthesia and surgery. This medicine may be used for other purposes; ask your health care provider or pharmacist if you have questions. COMMON BRAND NAME(S): Transderm Scop What should I tell my health care provider before I take this medicine? They need to know if you have any of these conditions:  are scheduled to have a gastric secretion test  glaucoma  heart disease  kidney disease  liver disease  lung or breathing disease, like asthma  mental illness  prostate disease  seizures  stomach or intestine problems  trouble passing urine  an unusual or allergic reaction to scopolamine, atropine, other medicines, foods, dyes, or preservatives  pregnant or trying to get pregnant  breast-feeding How should I use this medicine? This medicine is for external use only. Follow the directions on the prescription label. Wear only 1 patch at a time. Choose an area behind the ear, that is clean, dry, hairless and free from any cuts or irritation. Wipe the area  with a clean dry tissue. Peel off the plastic backing of the skin patch, trying not to touch the adhesive side with your hands. Do not cut the patches. Firmly apply to the area you have chosen, with the metallic side of the patch to the skin and the tan-colored side showing. Once firmly in place, wash your hands well with soap and water. Do not get this medicine into your eyes. After removing the patch, wash your hands and the area behind your ear thoroughly with soap and water. The patch will still contain some medicine after use. To avoid accidental contact or ingestion by children or pets, fold the used patch in half with the sticky side  together and throw away in the trash out of the reach of children and pets. If you need to use a second patch after you remove the first, place it behind the other ear. A special MedGuide will be given to you by the pharmacist with each prescription and refill. Be sure to read this information carefully each time. Talk to your pediatrician regarding the use of this medicine in children. Special care may be needed. Overdosage: If you think you have taken too much of this medicine contact a poison control center or emergency room at once. NOTE: This medicine is only for you. Do not share this medicine with others. What if I miss a dose? This does not apply. This medicine is not for regular use. What may interact with this medicine?  alcohol  antihistamines for allergy cough and cold  atropine  certain medicines for anxiety or sleep  certain medicines for bladder problems like oxybutynin, tolterodine  certain medicines for depression like amitriptyline, fluoxetine, sertraline  certain medicines for stomach problems like dicyclomine, hyoscyamine  certain medicines for Parkinson's disease like benztropine, trihexyphenidyl  certain medicines for seizures like phenobarbital, primidone  general anesthetics like halothane, isoflurane, methoxyflurane,  propofol  ipratropium  local anesthetics like lidocaine, pramoxine, tetracaine  medicines that relax muscles for surgery  phenothiazines like chlorpromazine, mesoridazine, prochlorperazine, thioridazine  narcotic medicines for pain  other belladonna alkaloids This list may not describe all possible interactions. Give your health care provider a list of all the medicines, herbs, non-prescription drugs, or dietary supplements you use. Also tell them if you smoke, drink alcohol, or use illegal drugs. Some items may interact with your medicine. What should I watch for while using this medicine? Limit contact with water while swimming and bathing because the patch may fall off. If the patch falls off, throw it away and put a new one behind the other ear. You may get drowsy or dizzy. Do not drive, use machinery, or do anything that needs mental alertness until you know how this medicine affects you. Do not stand or sit up quickly, especially if you are an older patient. This reduces the risk of dizzy or fainting spells. Alcohol may interfere with the effect of this medicine. Avoid alcoholic drinks. Your mouth may get dry. Chewing sugarless gum or sucking hard candy, and drinking plenty of water may help. Contact your healthcare professional if the problem does not go away or is severe. This medicine may cause dry eyes and blurred vision. If you wear contact lenses, you may feel some discomfort. Lubricating drops may help. See your healthcare professional if the problem does not go away or is severe. If you are going to need surgery, an MRI, CT scan, or other procedure, tell your healthcare professional that you are using this medicine. You may need to remove the patch before the procedure. What side effects may I notice from receiving this medicine? Side effects that you should report to your doctor or health care professional as soon as possible:  allergic reactions like skin rash, itching or  hives; swelling of the face, lips, or tongue  blurred vision  changes in vision  confusion  dizziness  eye pain  fast, irregular heartbeat  hallucinations, loss of contact with reality  nausea, vomiting  pain or trouble passing urine  restlessness  seizures  skin irritation  stomach pain Side effects that usually do not require medical attention (report to your doctor or health care professional if they continue or are bothersome):  drowsiness  dry mouth  headache  sore throat This list may not describe all possible side effects. Call your doctor for medical advice about side effects. You may report side effects to FDA at 1-800-FDA-1088. Where should I keep my medicine? Keep out of the reach of children. Store at room temperature between 20 and 25 degrees C (68 and 77 degrees F). Keep this medicine in the foil package until ready to use. Throw away any unused medicine after the expiration date. NOTE: This sheet is a summary. It may not cover all possible information. If you have questions about this medicine, talk to your doctor, pharmacist, or health care provider.  2021 Elsevier/Gold Standard (2018-01-11 16:14:46)   General Anesthesia, Adult, Care After This sheet gives you information about how to care for yourself after your procedure. Your health care provider may also give you more specific instructions. If you have problems or questions, contact your health care provider. What can I expect after the procedure? After the procedure, the following side effects are common:  Pain or discomfort at the IV site.  Nausea.  Vomiting.  Sore throat.  Trouble concentrating.  Feeling cold or chills.  Feeling weak or tired.  Sleepiness and fatigue.  Soreness and body aches. These side effects can affect parts of the body that were not involved in surgery. Follow these instructions at home: For the time period you were told by your health care  provider:  Rest.  Do not participate in activities where you could fall or become injured.  Do not drive or use machinery.  Do not drink alcohol.  Do not take sleeping pills or medicines that cause drowsiness.  Do not make important decisions or sign legal documents.  Do not take care of children on your own.   Eating and drinking  Follow any instructions from your health care provider about eating or drinking restrictions.  When you feel hungry, start by eating small amounts of foods that are soft and easy to digest (bland), such as toast. Gradually return to your regular diet.  Drink enough fluid to keep your urine pale yellow.  If you vomit, rehydrate by drinking water, juice, or clear broth. General instructions  If you have sleep apnea, surgery and certain medicines can increase your risk for breathing problems. Follow instructions from your health care provider about wearing your sleep device: ? Anytime you are sleeping, including during daytime naps. ? While taking prescription pain medicines, sleeping medicines, or medicines that make you drowsy.  Have a responsible adult stay with you for the time you are told. It is important to have someone help care for you until you are awake and alert.  Return to your normal activities as told by your health care provider. Ask your health care provider what activities are safe for you.  Take over-the-counter and prescription medicines only as told by your health care provider.  If you smoke, do not smoke without supervision.  Keep all follow-up visits as told by your health care provider. This is important. Contact a health care provider if:  You have nausea or vomiting that does not get better with medicine.  You cannot eat or drink without vomiting.  You have pain that does not get better with medicine.  You are unable to pass urine.  You develop a skin rash.  You have a fever.  You have redness around your IV site  that gets worse. Get help right away if:  You have difficulty breathing.  You have chest pain.  You have blood in your urine or stool, or you vomit blood. Summary  After the procedure, it is common to have a sore throat or nausea. It is also common to feel tired.  Have a responsible adult stay with you for the time you are told. It is important to have someone help care for you until you are awake and alert.  When you feel hungry, start by eating small amounts of foods that are soft and easy to digest (bland), such as toast. Gradually return to your regular diet.  Drink enough fluid to keep your urine pale yellow.  Return to your normal activities as told by your health care provider. Ask your health care provider what activities are safe for you. This information is not intended to replace advice given to you by your health care provider. Make sure you discuss any questions you have with your health care provider. Document Revised: 07/08/2020 Document Reviewed: 02/05/2020 Elsevier Patient Education  2021 [employer].

## 2021-02-15 NOTE — Anesthesia Preprocedure Evaluation (Signed)
Anesthesia Evaluation  Patient identified by MRN, date of birth, ID band Patient awake    Reviewed: Allergy & Precautions, H&P , NPO status , Patient's Chart, lab work & pertinent test results  Airway Mallampati: III  TM Distance: >3 FB Neck ROM: full    Dental no notable dental hx.    Pulmonary    Pulmonary exam normal breath sounds clear to auscultation       Cardiovascular Normal cardiovascular exam Rhythm:regular Rate:Normal     Neuro/Psych    GI/Hepatic   Endo/Other  Morbid obesity  Renal/GU      Musculoskeletal   Abdominal   Peds  Hematology   Anesthesia Other Findings   Reproductive/Obstetrics                             Anesthesia Physical Anesthesia Plan  ASA: II  Anesthesia Plan: General ETT   Post-op Pain Management:    Induction:   PONV Risk Score and Plan: 3 and Treatment may vary due to age or medical condition, Ondansetron, Dexamethasone and Scopolamine patch - Pre-op  Airway Management Planned:   Additional Equipment:   Intra-op Plan:   Post-operative Plan:   Informed Consent: I have reviewed the patients History and Physical, chart, labs and discussed the procedure including the risks, benefits and alternatives for the proposed anesthesia with the patient or authorized representative who has indicated his/her understanding and acceptance.     Dental Advisory Given  Plan Discussed with: CRNA  Anesthesia Plan Comments:         Anesthesia Quick Evaluation

## 2021-02-16 ENCOUNTER — Encounter: Payer: Self-pay | Admitting: Otolaryngology

## 2021-10-02 ENCOUNTER — Emergency Department: Payer: BC Managed Care – PPO

## 2021-10-02 ENCOUNTER — Emergency Department
Admission: EM | Admit: 2021-10-02 | Discharge: 2021-10-02 | Disposition: A | Discharge status: Home / Self Care | Payer: BC Managed Care – PPO | Attending: Emergency Medicine | Admitting: Emergency Medicine

## 2021-10-02 ENCOUNTER — Other Ambulatory Visit: Payer: Self-pay

## 2021-10-02 ENCOUNTER — Encounter: Payer: Self-pay | Admitting: Emergency Medicine

## 2021-10-02 DIAGNOSIS — S51812A Laceration without foreign body of left forearm, initial encounter: Secondary | ICD-10-CM | POA: Insufficient documentation

## 2021-10-02 DIAGNOSIS — Z8616 Personal history of COVID-19: Secondary | ICD-10-CM | POA: Diagnosis not present

## 2021-10-02 DIAGNOSIS — R000 Tachycardia, unspecified: Secondary | ICD-10-CM | POA: Insufficient documentation

## 2021-10-02 DIAGNOSIS — W010XXA Fall on same level from slipping, tripping and stumbling without subsequent striking against object, initial encounter: Secondary | ICD-10-CM | POA: Insufficient documentation

## 2021-10-02 MED ORDER — LIDOCAINE-EPINEPHRINE 2 %-1:100000 IJ SOLN: 20.0000 mL | Freq: Once | INTRAMUSCULAR | Status: DC

## 2021-10-02 MED ORDER — CEPHALEXIN 500 MG PO CAPS: 500.0000 mg | ORAL_CAPSULE | Freq: Four times a day (QID) | ORAL | 0 refills | Status: AC

## 2021-10-02 MED ORDER — CEPHALEXIN 500 MG PO CAPS
500.0000 mg | ORAL_CAPSULE | Freq: Once | ORAL | Status: AC
  Administered 2021-10-02: 12:00:00 500 mg via ORAL
  Filled 2021-10-02: qty 1

## 2021-10-02 MED ORDER — ACETAMINOPHEN 500 MG PO TABS
1000.0000 mg | ORAL_TABLET | Freq: Once | ORAL | Status: AC
  Administered 2021-10-02: 11:00:00 1000 mg via ORAL
  Filled 2021-10-02: qty 2

## 2021-10-02 MED ORDER — TETANUS-DIPHTH-ACELL PERTUSSIS 5-2.5-18.5 LF-MCG/0.5 IM SUSY
0.5000 mL | PREFILLED_SYRINGE | Freq: Once | INTRAMUSCULAR | Status: DC
  Filled 2021-10-02: qty 0.5

## 2021-10-02 MED ORDER — BACITRACIN ZINC 500 UNIT/GM EX OINT
TOPICAL_OINTMENT | CUTANEOUS | Status: AC
  Filled 2021-10-02: qty 0.9

## 2021-10-02 MED ORDER — LIDOCAINE HCL (PF) 1 % IJ SOLN
INTRAMUSCULAR | Status: AC
  Filled 2021-10-02: qty 5

## 2021-10-02 MED ORDER — IBUPROFEN 400 MG PO TABS
400.0000 mg | ORAL_TABLET | Freq: Once | ORAL | Status: AC
  Administered 2021-10-02: 11:00:00 400 mg via ORAL
  Filled 2021-10-02: qty 1

## 2021-10-02 NOTE — ED Notes (Addendum)
Pt states she fell while holding glass after leaving a bar and cut her left forearm. Pt has ROM WNL and states she has normal feeling in left hand. Dressing intact, no bleeding noted at this time.

## 2021-10-02 NOTE — ED Notes (Signed)
Pt arm dressed. Dc ppw provided. Pt denies any questions. Pt assisted off unit on foot. Verbal consent for DC given

## 2021-10-02 NOTE — ED Triage Notes (Signed)
Pt via EMS from home. Pt was leaving a bar around 0200 this AM. Pt states that she tripped with a glass in her hand and the glass cut her L forearm. Bleeding controlled. Pt is A&OX4 and NAD. Large laceration noted to L wrist

## 2021-10-02 NOTE — ED Notes (Signed)
First Nurse Note:  Pt to ED via [facility] from home for fall. Pt states that she was standing outside holding a glass bottle and fell cutting her wrist. Bleeding is controlled at this time. VSS.

## 2021-10-02 NOTE — ED Provider Notes (Signed)
[facility] Emergency Department Provider Note  ____________________________________________   Event Date/Time   First MD Initiated Contact with Patient 10/02/21 1050     (approximate)  I have reviewed the triage vital signs and the nursing notes.   HISTORY  Chief Complaint Fall   HPI Deanna Nixon is a 30 y.o. female with below noted past medical history who presents for assessment of a laceration to her left forearm that she sustained after she tripped when she left the bar last night while waiting for an [employer].  She states she had some glass when she fell onto the pavement.  She did not hit anywhere else including her head or have any LOC.  She is not on blood thinners.  She denies any other injuries other than cut to her left frontal forearm.  She had her tetanus updated 2 years ago.  Otherwise has been in her usual state health without any recent fevers, chills, cough, nausea, vomiting, diarrhea, burning with urination, rash or other recent injuries or falls.  No other acute concerns at this time.         Past Medical History:  Diagnosis Date   Anemia    Bell's palsy 05/05/2014   COVID-19 06/2019    Patient Active Problem List   Diagnosis Date Noted   History of gestational hypertension 06/29/2016   History of gestational diabetes 06/29/2016   Obesity, unspecified 07/17/2014    Past Surgical History:  Procedure Laterality Date   CHOLECYSTECTOMY     CLOSED REDUCTION NASAL FRACTURE N/A 02/15/2021   Procedure: CLOSED REDUCTION NASAL FRACTURE;  Surgeon: Geanie Logan, MD;  Location: [facility];  Service: ENT;  Laterality: N/A;   SEPTOPLASTY N/A 02/15/2021   Procedure: Sharlett Iles;  Surgeon: Geanie Logan, MD;  Location: [facility];  Service: ENT;  Laterality: N/A;    Prior to Admission medications   Medication Sig Start Date End Date Taking? Authorizing Provider  cephALEXin (KEFLEX) 500 MG capsule Take 1 capsule (500 mg  total) by mouth 4 (four) times daily for 5 days. 10/02/21 10/07/21 Yes Gilles Chiquito, MD  doxycycline (VIBRAMYCIN) 100 MG capsule 100 mg PO BID x 10 days 02/15/21   Geanie Logan, MD  etonogestrel (NEXPLANON) 68 MG IMPL implant 1 each by Subdermal route once. 07/11/16   Sciora, Austin Miles, CNM  HYDROcodone-acetaminophen (NORCO/VICODIN) 5-325 MG tablet Take 1-2 tablets by mouth every 6 (six) hours as needed for moderate pain. 02/15/21   Geanie Logan, MD  Multiple Vitamin (MULTIVITAMIN) capsule Take 1 capsule by mouth daily. 04/21/20   Federico Flake, MD  ondansetron (ZOFRAN ODT) 8 MG disintegrating tablet Take 1 tablet (8 mg total) by mouth every 8 (eight) hours as needed for nausea or vomiting. Patient not taking: Reported on 02/09/2021 09/04/15   Sharman Cheek, MD    Allergies Patient has no known allergies.  Family History  Problem Relation Age of Onset   Diabetes Mother    Cancer Paternal Grandfather     Social History Social History   Tobacco Use   Smoking status: Never   Smokeless tobacco: Never  Vaping Use   Vaping Use: Never used  Substance Use Topics   Alcohol use: Yes    Alcohol/week: 1.0 standard drink    Types: 1 Glasses of wine per week    Comment: last use 01/08/21   Drug use: Never    Review of Systems  Review of Systems  Constitutional:  Negative for chills and fever.  HENT:  Negative for sore throat.   Eyes:  Negative for pain.  Respiratory:  Negative for cough and stridor.   Cardiovascular:  Negative for chest pain.  Gastrointestinal:  Negative for vomiting.  Genitourinary:  Negative for dysuria.  Musculoskeletal:  Positive for myalgias (L forearm).  Skin:  Negative for rash.  Neurological:  Negative for seizures, loss of consciousness and headaches.  Psychiatric/Behavioral:  Negative for suicidal ideas.   All other systems reviewed and are negative.    ____________________________________________   PHYSICAL EXAM:  VITAL SIGNS: ED  Triage Vitals  Enc Vitals Group     BP 10/02/21 0727 126/84     Pulse Rate 10/02/21 0727 (!) 112     Resp 10/02/21 0727 20     Temp 10/02/21 0727 98.5 F (36.9 C)     Temp Source 10/02/21 0727 Oral     SpO2 10/02/21 0727 96 %     Weight 10/02/21 0727 180 lb (81.6 kg)     Height 10/02/21 0727 4\' 9"  (1.448 m)     Head Circumference --      Peak Flow --      Pain Score 10/02/21 0736 0     Pain Loc --      Pain Edu? --      Excl. in GC? --    Vitals:   10/02/21 0727  BP: 126/84  Pulse: (!) 112  Resp: 20  Temp: 98.5 F (36.9 C)  SpO2: 96%   Physical Exam Vitals and nursing note reviewed.  Constitutional:      General: She is not in acute distress.    Appearance: She is well-developed.  HENT:     Head: Normocephalic and atraumatic.     Right Ear: External ear normal.     Left Ear: External ear normal.     Nose: Nose normal.  Eyes:     Conjunctiva/sclera: Conjunctivae normal.  Cardiovascular:     Rate and Rhythm: Regular rhythm. Tachycardia present.     Heart sounds: No murmur heard. Pulmonary:     Effort: Pulmonary effort is normal. No respiratory distress.  Abdominal:     General: There is no distension.     Palpations: Abdomen is soft.  Musculoskeletal:        General: No swelling.     Cervical back: Neck supple.  Skin:    General: Skin is warm and dry.     Capillary Refill: Capillary refill takes less than 2 seconds.  Neurological:     Mental Status: She is alert.  Psychiatric:        Mood and Affect: Mood normal.    Fairly large complex laceration hemodynamically stable over the anterior left forearm.  It is primarily horizontally oriented with a vertically oriented section midline forming a T shape.  Subcu fat is visible and some dusky appearing skin.  2+ radial pulses.  Sensation is intact distally in the radial ulnar median nerve distributions.  Patient is able to flex and extend all digits in the left hand against resistance.  She is able to flex and extend  the wrist.  Sensation is intact proximally and there is no other evidence of trauma or lacerations elsewhere about the hand forearm or elbow.  A horizontal part of the laceration is approximately 5 cm and the T shaped horizontal part is 1.5. ____________________________________________   LABS (all labs ordered are listed, but only abnormal results are displayed)  Labs Reviewed - No data to display ____________________________________________  EKG  ____________________________________________  RADIOLOGY  ED MD interpretation: X-ray of the left forearm shows no Orthopedic injury or retained foreign body.  Official radiology report(s): DG Forearm Left  Result Date: 10/02/2021 CLINICAL DATA:  Trauma EXAM: LEFT FOREARM - 2 VIEW COMPARISON:  None. FINDINGS: No fracture or dislocation is seen. There is soft tissue deformity along the medial volar aspect of the distal forearm, possibly suggesting skin laceration. There are no radiopaque foreign bodies. IMPRESSION: No fracture or dislocation is seen. There are no radiopaque foreign bodies. Electronically Signed   By: Ernie Avena M.D.   On: 10/02/2021 08:15    ____________________________________________   PROCEDURES  Procedure(s) performed (including Critical Care):  Marland KitchenMarland KitchenLaceration Repair  Date/Time: 10/02/2021 11:50 AM Performed by: Gilles Chiquito, MD Authorized by: Gilles Chiquito, MD   Consent:    Consent obtained:  Verbal   Consent given by:  Patient   Risks, benefits, and alternatives were discussed: yes     Risks discussed:  Infection, pain, need for additional repair, poor cosmetic result, poor wound healing and nerve damage   Alternatives discussed:  No treatment Universal protocol:    Procedure explained and questions answered to patient or proxy's satisfaction: yes     Patient identity confirmed:  Verbally with patient Anesthesia:    Anesthesia method:  Local infiltration   Local anesthetic:  Lidocaine 1%  w/o epi Laceration details:    Location:  Shoulder/arm   Shoulder/arm location:  L lower arm   Length (cm):  5   Depth (mm):  0.5 Pre-procedure details:    Preparation:  Imaging obtained to evaluate for foreign bodies Exploration:    Limited defect created (wound extended): no     Hemostasis achieved with:  Direct pressure   Imaging obtained: x-ray     Imaging outcome: foreign body not noted     Wound exploration: wound explored through full range of motion     Wound extent: fascia violated     Wound extent: no nerve damage noted, no tendon damage noted, no underlying fracture noted and no vascular damage noted     Contaminated: no   Treatment:    Area cleansed with:  Saline   Amount of cleaning:  Extensive   Irrigation solution:  Sterile water   Irrigation method:  Syringe   Visualized foreign bodies/material removed: no     Debridement:  Minimal   Undermining:  None   Scar revision: no   Skin repair:    Repair method:  Sutures   Suture size:  5-0   Wound skin closure material used: ethylon.   Suture technique:  Simple interrupted   Number of sutures:  18 Approximation:    Approximation:  Close Repair type:    Repair type:  Intermediate (woudn required removal of dead skin tissue and dusky fat and facia prior to closure) Post-procedure details:    Dressing:  Antibiotic ointment   Procedure completion:  Tolerated well, no immediate complications   ____________________________________________   INITIAL IMPRESSION / ASSESSMENT AND PLAN / ED COURSE      Patient presents with above-stated history exam for assessment of a laceration she sustained after falling and tripping on some glass yesterday evening.  On arrival she is tachycardic with otherwise stable vital signs on room air.  On exam she is a fairly large hemostatic laceration described above but is neurovascular intact distally.  X-ray of the left forearm shows no Orthopedic injury or retained foreign  body.   Tetanus is  already been updated in the last 5 years.  Wound was extensively cleaned and irrigated with sterile saline.  Repaired per procedure note above.  Low suspicion for other significant underlying occult injury.  No snuffbox tenderness.  We will start on a short course of Keflex for cellulitis prophylaxis given extent of the injury and dusky appearing skin and fairly significant mount of subcu fat and fascia that appears to have been involved.  Discharged in stable condition.  Strict return cautions advised and discussed.        ____________________________________________   FINAL CLINICAL IMPRESSION(S) / ED DIAGNOSES  Final diagnoses:  Laceration of left forearm, initial encounter    Medications  lidocaine-EPINEPHrine (XYLOCAINE W/EPI) 2 %-1:100000 (with pres) injection 20 mL (has no administration in time range)  lidocaine (PF) (XYLOCAINE) 1 % injection (has no administration in time range)  cephALEXin (KEFLEX) capsule 500 mg (has no administration in time range)  bacitracin ointment (has no administration in time range)  acetaminophen (TYLENOL) tablet 1,000 mg (1,000 mg Oral Given 10/02/21 1121)  ibuprofen (ADVIL) tablet 400 mg (400 mg Oral Given 10/02/21 1121)     ED Discharge Orders          Ordered    cephALEXin (KEFLEX) 500 MG capsule  4 times daily        10/02/21 1140             Note:  This document was prepared using Dragon voice recognition software and may include unintentional dictation errors.    Gilles Chiquito, MD 10/02/21 [phone]

## 2022-06-02 IMAGING — CR DG FOREARM 2V*L*
1 series · 2 of 2 positions shown · non-contrast
Comparison: None.

CLINICAL DATA: Trauma

EXAM:
LEFT FOREARM - 2 VIEW

[Series 1: dg forearm left · 0.14mm/px · 2 of 2 slices shown]
[im 1/2]
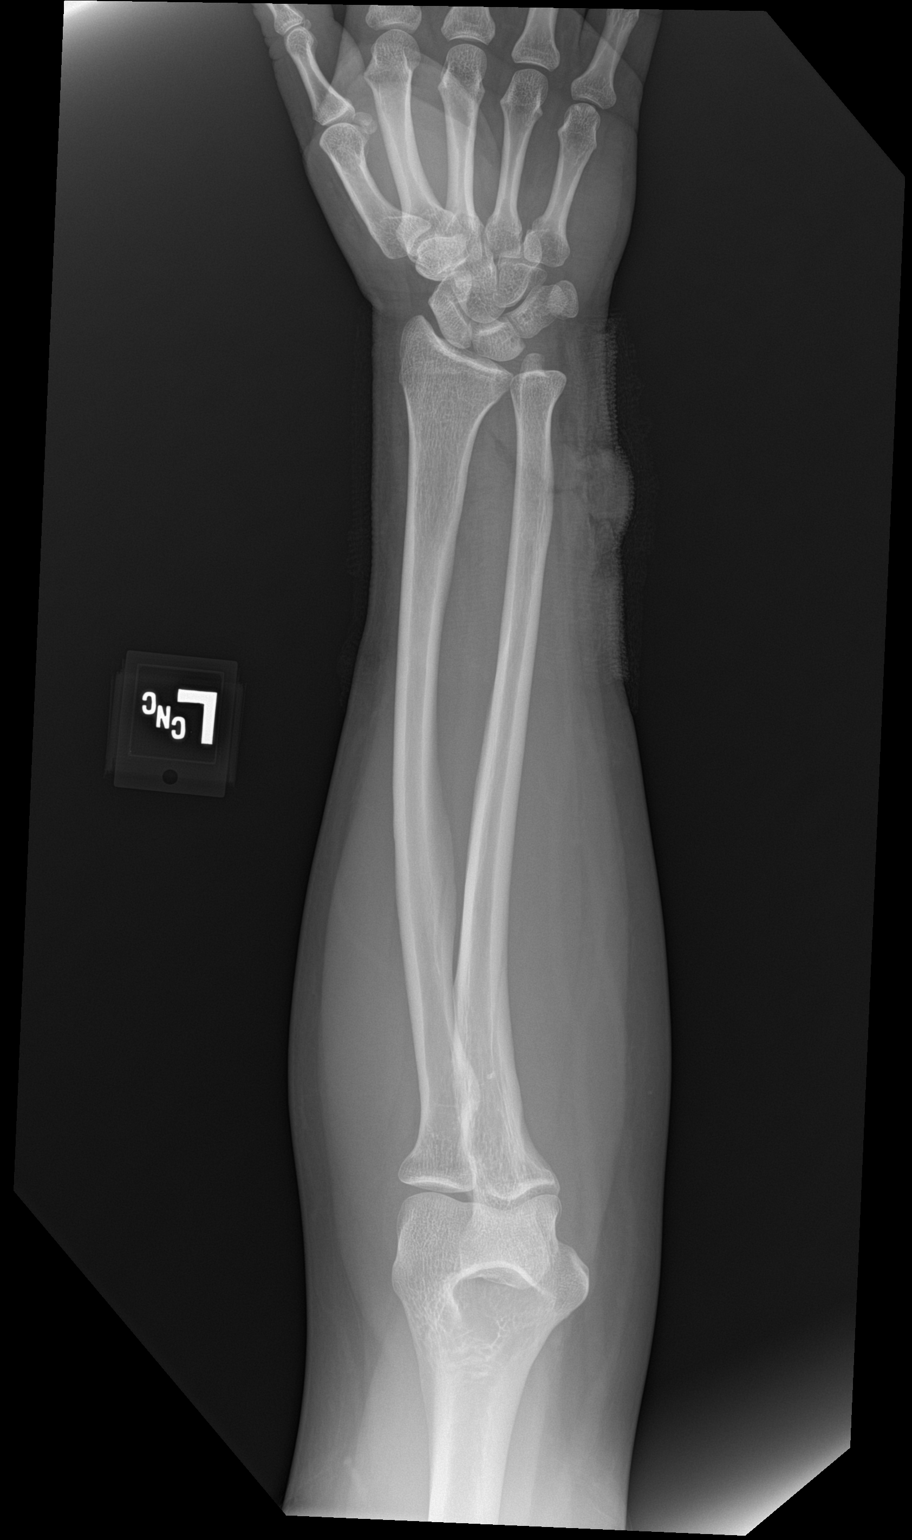
[im 2/2]
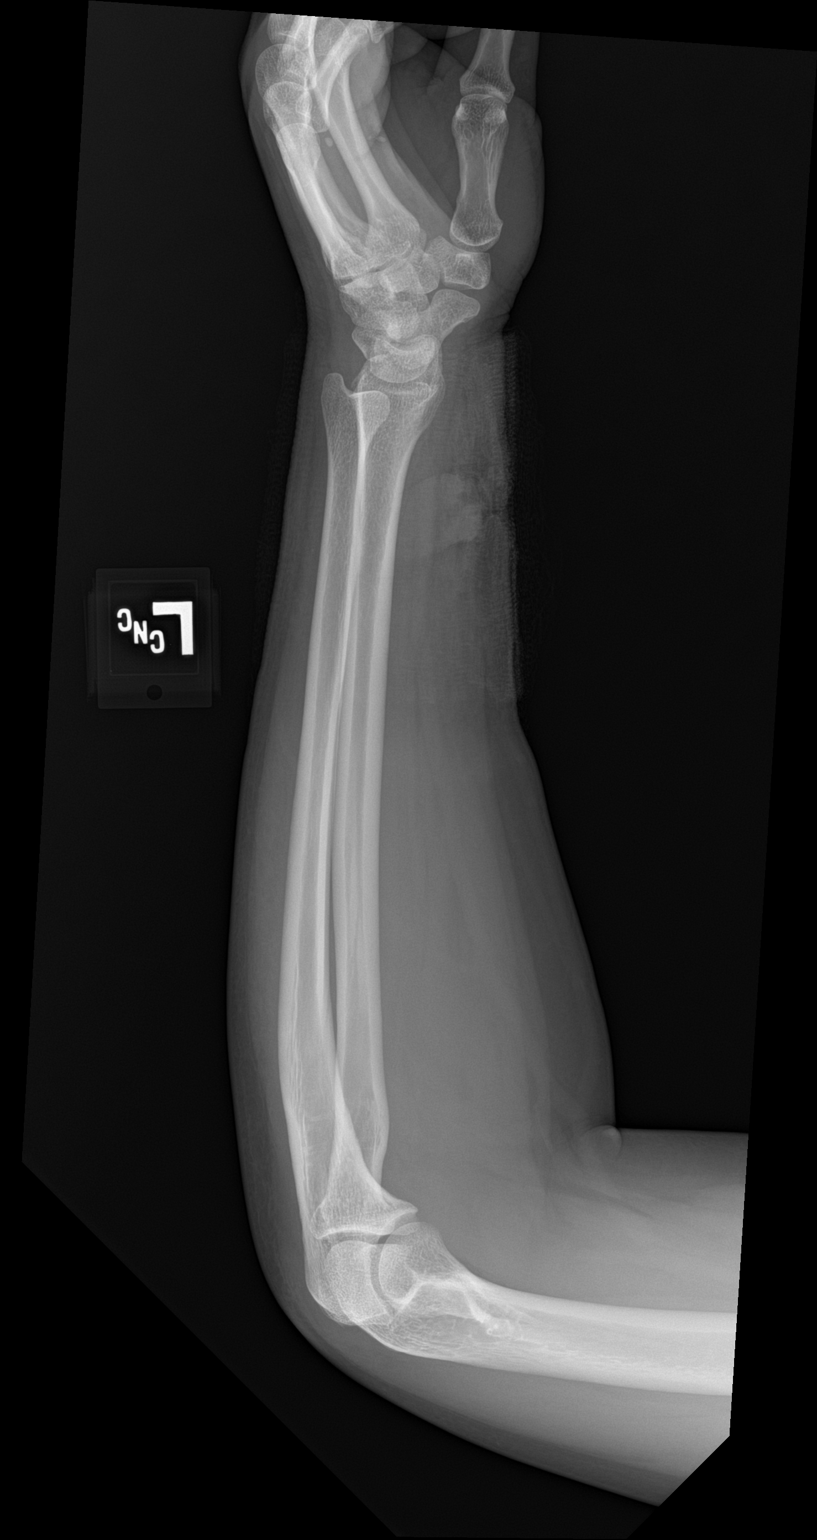

[2 of 2 positions shown; findings below may reference images not displayed]

FINDINGS: No fracture or dislocation is seen. There is soft tissue deformity
along the medial volar aspect of the distal forearm, possibly
suggesting skin laceration. There are no radiopaque foreign bodies.
IMPRESSION: No fracture or dislocation is seen. There are no radiopaque foreign
bodies.

## 2022-09-30 ENCOUNTER — Emergency Department
Admission: EM | Admit: 2022-09-30 | Discharge: 2022-10-01 | Disposition: A | Discharge status: Home / Self Care | Payer: BC Managed Care – PPO | Attending: Emergency Medicine | Admitting: Emergency Medicine

## 2022-09-30 ENCOUNTER — Other Ambulatory Visit: Payer: Self-pay

## 2022-09-30 DIAGNOSIS — N898 Other specified noninflammatory disorders of vagina: Secondary | ICD-10-CM | POA: Insufficient documentation

## 2022-09-30 LAB — URINALYSIS, ROUTINE W REFLEX MICROSCOPIC
Bacteria, UA: NONE SEEN
Bilirubin Urine: NEGATIVE
Glucose, UA: >=500 mg/dL — AB
Ketones, ur: 5 mg/dL — AB
Leukocytes,Ua: NEGATIVE
Nitrite: NEGATIVE
Protein, ur: NEGATIVE mg/dL
Specific Gravity, Urine: 1.031 — ABNORMAL HIGH (ref 1.005–1.030)
pH: 6 (ref 5.0–8.0)

## 2022-09-30 LAB — WET PREP, GENITAL
Clue Cells Wet Prep HPF POC: NONE SEEN
Sperm: NONE SEEN
Trich, Wet Prep: NONE SEEN
WBC, Wet Prep HPF POC: <10 (ref <10)
Yeast Wet Prep HPF POC: NONE SEEN

## 2022-09-30 LAB — POC URINE PREG, ED: Preg Test, Ur: NEGATIVE

## 2022-09-30 NOTE — ED Triage Notes (Signed)
Pt reports seeing intermittent vaginal discharge when wiping for past 3 months. None noted when wearing pads. Color is brownish/blackish per patient. Pt denies fever. Pt reports mild burning in vaginal area starting yesterday. PT states she has a Nexplanon implant in place since 11/04/20.

## 2022-10-01 LAB — CHLAMYDIA/NGC RT PCR (ARMC ONLY)
Chlamydia Tr: NOT DETECTED
N gonorrhoeae: NOT DETECTED

## 2022-10-01 NOTE — Discharge Instructions (Signed)
Your workup in the Emergency Department today was reassuring.  We did not find any specific abnormalities.  We recommend you drink plenty of fluids, take your regular medications and/or any new ones prescribed today, and follow up with the doctor(s) listed in these documents as recommended.  Return to the Emergency Department if you develop new or worsening symptoms that concern you.  

## 2022-10-01 NOTE — ED Provider Notes (Signed)
[facility] Provider Note    Event Date/Time   First MD Initiated Contact with Patient 09/30/22 2327     (approximate)   History   Vaginal Discharge   HPI  Deanna Nixon is a 31 y.o. female who presents for evaluation of about 3 months of intermittent dark-colored vaginal discharge.  She said it only occurs now and then and sometimes is brown and is sometimes darker and looks a little bit red.  It happens only occasionally and she has not been evaluated for it.  She has no burning or itching.  She had a little bit of burning when she urinated earlier today but that was only 1 time.  She does not have regular menstrual cycles because she has an implanted contraceptive device that she has had for about 6 years.  [facility] but has not been to see GYN.  She has no abdominal pain or pelvic pain.  No pain during intercourse.  No concerns about STDs.     Physical Exam   Triage Vital Signs: ED Triage Vitals  Enc Vitals Group     BP 09/30/22 2314 (!) 146/97     Pulse Rate 09/30/22 2314 90     Resp 09/30/22 2314 15     Temp 09/30/22 2314 98.4 F (36.9 C)     Temp Source 09/30/22 2314 Oral     SpO2 09/30/22 2314 98 %     Weight 09/30/22 2311 72.6 kg (160 lb)     Height 09/30/22 2311 1.448 m (4\' 9" )     Head Circumference --      Peak Flow --      Pain Score 09/30/22 2311 4     Pain Loc --      Pain Edu? --      Excl. in GC? --     Most recent vital signs: Vitals:   09/30/22 2314 10/01/22 0201  BP: (!) 146/97 (!) 144/85  Pulse: 90 90  Resp: 15 18  Temp: 98.4 F (36.9 C) 98.3 F (36.8 C)  SpO2: 98% 98%     General: Awake, no distress.  CV:  Good peripheral perfusion.  Resp:  Normal effort.  Abd:  No distention.  No tenderness to palpation the abdomen. Other:  Patient declined GU exam.   ED Results / Procedures / Treatments   Labs (all labs ordered are listed, but only abnormal results are displayed) Labs Reviewed   URINALYSIS, ROUTINE W REFLEX MICROSCOPIC - Abnormal; Notable for the following components:      Result Value   Color, Urine STRAW (*)    APPearance CLEAR (*)    Specific Gravity, Urine 1.031 (*)    Glucose, UA >=500 (*)    Hgb urine dipstick SMALL (*)    Ketones, ur 5 (*)    All other components within normal limits  WET PREP, GENITAL  CHLAMYDIA/NGC RT PCR ([facility] ONLY)            POC URINE PREG, ED       MEDICATIONS ORDERED IN ED: Medications - No data to display   IMPRESSION / MDM / ASSESSMENT AND PLAN / ED COURSE  I reviewed the triage vital signs and the nursing notes.                              Differential diagnosis includes, but is not limited to, nonspecific vaginal spotting,  UTI, STD/PID, pregnancy.  Patient's presentation is most consistent with acute complicated illness / injury requiring diagnostic workup.  Labs/studies ordered include wet prep, chlamydia/GC, urine pregnancy test, urinalysis.  The patient was given the instructions and swabs necessary to perform a self swab collection before I saw her, and the labs are in process.  Her urine pregnancy test is negative.  I confirmed with her that she is having no pain, she was just worried because of the intermittent dark-colored discharge over the last few months.  I offered to perform a pelvic exam, but given that she has already self swab, she does not want to proceed with a pelvic at this time, and I think that is reasonable.  She states that she gets annual pelvic exams at the health department and had one recently although she does not remember exactly when.  There is no indication for an ultrasound at this time.  If the rest of her work-up is reassuring I told her that she can follow-up with GYN and I will give her contact information.  She agrees with the plan.   Clinical Course as of 10/01/22 0304  Sat Sep 30, 2022  2349 Urinalysis, Routine w reflex microscopic Urine, Clean Catch(!) No clear indication  of infection on urinalysis. [CF]  Sun Oct 01, 2022  0151 [place]illian rt PCR ([place] only) Negative GC chlamydia, negative wet prep, negative pregnancy test.  Patient is comfortable with the plan for discharge and outpatient follow-up with GYN as discussed previously.  I gave my usual and customary return precautions. [CF]    Clinical Course User Index [CF] Hinda Kehr, MD     FINAL CLINICAL IMPRESSION(S) / ED DIAGNOSES   Final diagnoses:  Vaginal discharge     Rx / DC Orders   ED Discharge Orders     None        Note:  This document was prepared using Dragon voice recognition software and may include unintentional dictation errors.   Hinda Kehr, MD 10/01/22 [phone]

## 2023-04-26 ENCOUNTER — Ambulatory Visit: Payer: Self-pay

## 2023-08-13 ENCOUNTER — Ambulatory Visit: Payer: Self-pay

## 2024-06-04 ENCOUNTER — Emergency Department
Admission: EM | Admit: 2024-06-04 | Discharge: 2024-06-04 | Discharge status: Against Medical Advice | Payer: Self-pay | Attending: Emergency Medicine | Admitting: Emergency Medicine

## 2024-06-04 ENCOUNTER — Other Ambulatory Visit: Payer: Self-pay

## 2024-06-04 DIAGNOSIS — L02214 Cutaneous abscess of groin: Secondary | ICD-10-CM | POA: Insufficient documentation

## 2024-06-04 DIAGNOSIS — Z5321 Procedure and treatment not carried out due to patient leaving prior to being seen by health care provider: Secondary | ICD-10-CM | POA: Insufficient documentation

## 2024-06-04 NOTE — ED Triage Notes (Signed)
 Pt reports abscess to pubic region x1 week. Pt reports she scratched it today and had some drainage

## 2024-11-19 ENCOUNTER — Ambulatory Visit: Payer: Self-pay

## 2024-11-19 VITALS — BP 138/92 | HR 107 | Ht <= 58 in | Wt 188.4 lb

## 2024-11-19 DIAGNOSIS — Z3046 Encounter for surveillance of implantable subdermal contraceptive: Secondary | ICD-10-CM

## 2024-11-19 DIAGNOSIS — L700 Acne vulgaris: Secondary | ICD-10-CM

## 2024-11-19 DIAGNOSIS — Z124 Encounter for screening for malignant neoplasm of cervix: Secondary | ICD-10-CM

## 2024-11-19 DIAGNOSIS — Z113 Encounter for screening for infections with a predominantly sexual mode of transmission: Secondary | ICD-10-CM

## 2024-11-19 DIAGNOSIS — Z30017 Encounter for initial prescription of implantable subdermal contraceptive: Secondary | ICD-10-CM

## 2024-11-19 DIAGNOSIS — R030 Elevated blood-pressure reading, without diagnosis of hypertension: Secondary | ICD-10-CM

## 2024-11-19 DIAGNOSIS — B372 Candidiasis of skin and nail: Secondary | ICD-10-CM

## 2024-11-19 DIAGNOSIS — Z3009 Encounter for other general counseling and advice on contraception: Secondary | ICD-10-CM

## 2024-11-19 DIAGNOSIS — Z3202 Encounter for pregnancy test, result negative: Secondary | ICD-10-CM

## 2024-11-19 LAB — WET PREP FOR TRICH, YEAST, CLUE
Clue Cell Exam: NEGATIVE
Trichomonas Exam: NEGATIVE
Yeast Exam: NEGATIVE

## 2024-11-19 LAB — HM HIV SCREENING LAB: HM HIV Screening: NEGATIVE

## 2024-11-19 LAB — PREGNANCY, URINE: Preg Test, Ur: NEGATIVE

## 2024-11-19 MED ORDER — TRETINOIN 0.05 % EX CREA: 1.0000 | TOPICAL_CREAM | Freq: Every day | CUTANEOUS | 5 refills | Status: AC

## 2024-11-19 MED ORDER — ETONOGESTREL 68 MG ~~LOC~~ IMPL
68.0000 mg | DRUG_IMPLANT | Freq: Once | SUBCUTANEOUS | Status: AC
  Administered 2024-11-19: 68 mg via SUBCUTANEOUS

## 2024-11-19 MED ORDER — CLOTRIMAZOLE 1 % VA CREA: 1.0000 | TOPICAL_CREAM | Freq: Every day | VAGINAL | Status: AC

## 2024-11-19 NOTE — Progress Notes (Signed)
 Pt is here for PE/Nexplanon  removal/reinsertion. Nexplanon  removed successfully from the Lt Arm and a new one reinserted by Damien Satchel, [facility]. Pt tolerated well to the process with no complications.Wet prep results reviewed with patient and was dispensed with Clotrimazole  1% cream. Pt was counseled regarding the cream and opportunity given to pt to ask questions for any clarifications. Questions Answered. Wilkie Drought, RN.

## 2024-11-19 NOTE — Progress Notes (Signed)
 " [facility] [address] [place] [address] Main phone: [phone]  Family Planning Visit - Repeat Yearly Visit  Subjective:  Deanna Nixon is a 34 y.o. G2P2002  being seen today for an annual wellness visit and to discuss contraception options. The patient is currently using hormonal implant for pregnancy prevention. Patient does not want a pregnancy in the next year.   Desires Nexplanon  removal and reinsertion.  Patient has the following medical problems:  Patient Active Problem List   Diagnosis Date Noted   History of gestational hypertension 06/29/2016   History of gestational diabetes 06/29/2016   Obesity, unspecified 07/17/2014   Chief Complaint  Patient presents with   Annual Exam    PE/Nexplanon  removal and reinsertion   HPI Patient reports she would like Nexplanon  removal/reinsertion. Device was placed in December 2021.  Desires STI testing today. Does have some intermittent vaginal itching, no discharge.  Due for pap test today. Last was normal in 2021.  2 months ago, worsened acne. Saw dermatologist around 2 years ago for acne and she was prescribed tretinoin .   Patient denies other concerns.    Review of Systems  All other systems reviewed and are negative.  See flowsheet for further details and programmatic requirements Hyperlink available at the top of the signed note in blue.  Flow sheet content below:  Pregnancy Intention Screening Does the patient want to become pregnant in the next year?: No Does the patient's partner want to become pregnant in the next year?: N/A Would the patient like to discuss contraceptive options today?: No Sexual History What age did you start your period?: 10 How often do you have your period?: irregular Date of last sex?: 11/12/24 Has the patient had unprotected sex within the last 5 days?: No Do you have sex with men, women, both men and women?: Men  only In the past 2 months how many partners have you had sex with?: 1 In the past 12 months, how many partners have you had sex with?: 1 Is it possible that any of your sex partners in the past 12 months had sex with someone else whild they were still in a sexual relationship with you?: No What ways do you have sex?: Vaginal Do you or your partner use condoms and/or dental dams every time you have vaginal, oral or anal sex?: No Do you douche?: No Date of last HIV test?: 11/11/20 Have you ever had an STD?: No Have any of your partners had an STD?: No Have you or your partner ever shot up drugs?: No Have any of your partners used drugs in the past?: No Have you or your partners exchanged money or drugs for sex?: No  Diabetes screening This patient is 34 y.o. with a BMI of Body mass index is 40.77 kg/m.SABRA  Is patient eligible for diabetes screening (age >35 and BMI >25)?  no  Was Hgb A1c ordered? no  STI screening Patient reports 1 of partners in last year.  Does this patient desire STI screening?  Yes  Hepatitis C screening Has patient been screened once for HCV in the past?  Yes   Lab Results  Component Value Date   HCVAB <0.1 07/17/2019    Does the patient meet criteria for HCV testing? No  (If yes-- Screen for HCV through [facility]) Criteria:  Since the last HCV result, does the patient have any of the following? - Current drug use - Have  a partner with drug use - Has been incarcerated  Hepatitis B screening Does the patient meet criteria for HBV testing? No Criteria:  -Household, sexual or needle sharing contact with HBV -History of drug use -HIV positive -Those with known Hep C  Cervical Cancer Screening  Result Date Procedure Results Follow-ups  11/04/2020 IGP, rfx Aptima HPV ASCU DIAGNOSIS:: Comment Specimen adequacy:: Comment Clinician Provided ICD10: Comment Performed by:: Comment PAP Smear Comment: . Note:: Comment Test Methodology: CANCELED PAP  Reflex: Comment   07/25/2019 IGP, rfx Aptima HPV ASCU DIAGNOSIS:: Comment Specimen adequacy:: Comment Clinician Provided ICD10: Comment Performed by:: Comment PAP Smear Comment: . Note:: Comment Test Methodology: Comment PAP Reflex: Comment   06/29/2016 HM PAP SMEAR HM Pap smear: Negative     Health Maintenance Due  Topic Date Due   DTaP/Tdap/Td (1 - Tdap) Never done   Hepatitis B Vaccines 19-59 Average Risk (1 of 3 - 19+ 3-dose series) Never done   HPV VACCINES (1 - 3-dose SCDM series) Never done   Cervical Cancer Screening (HPV/Pap Cotest)  11/05/2023   Influenza Vaccine  06/06/2024   COVID-19 Vaccine (1 - 2025-26 season) Never done    The following portions of the patient's history were reviewed and updated as appropriate: allergies, current medications, past family history, past medical history, past social history, past surgical history and problem list. Problem list updated.  Objective:   Vitals:   11/19/24 1424  BP: (!) 138/92  Pulse: (!) 107  Weight: 188 lb 6.4 oz (85.5 kg)  Height: 4' 9 (1.448 m)   Physical Exam Vitals and nursing note reviewed. Exam conducted with a chaperone present Brett Orange).  Constitutional:      Appearance: Normal appearance.  HENT:     Head: Normocephalic and atraumatic.     Comments: No nits or hair loss of scalp, brows, and lashes    Mouth/Throat:     Mouth: Mucous membranes are moist.     Pharynx: Oropharynx is clear. No oropharyngeal exudate or posterior oropharyngeal erythema.  Eyes:     General:        Right eye: No discharge.        Left eye: No discharge.     Conjunctiva/sclera: Conjunctivae normal.     Right eye: Right conjunctiva is not injected.     Left eye: Left conjunctiva is not injected.  Pulmonary:     Effort: Pulmonary effort is normal.  Chest:  Breasts:    Right: Normal.     Left: Normal.  Abdominal:     Tenderness: There is no abdominal tenderness. There is no rebound.  Genitourinary:    General: Normal  vulva.     Exam position: Lithotomy position.     Pubic Area: Rash present. No pubic lice.      Labia:        Right: Rash present. No lesion.        Left: Rash present. No lesion.      Vagina: Normal. No vaginal discharge, erythema or lesions.     Cervix: No cervical motion tenderness, discharge, lesion or erythema.     Comments: pH = <4.5 External hypopigmented, scaly, erythematous rash consistent with yeast dermatitis. Some small fissures at base of labia majora.  Lymphadenopathy:     Cervical: No cervical adenopathy.     Upper Body:     Right upper body: No supraclavicular, axillary or epitrochlear adenopathy.     Left upper body: No supraclavicular, axillary or epitrochlear adenopathy.  Lower Body: No right inguinal adenopathy. No left inguinal adenopathy.  Skin:    General: Skin is warm and dry.     Findings: No lesion or rash.  Neurological:     Mental Status: She is alert and oriented to person, place, and time.     Assessment and Plan:  Deanna Nixon is a 34 y.o. female G2P2002 presenting to the [facility] for an yearly wellness and contraception visit  Family planning  Contraception counseling:  Reviewed options based on patient desire and reproductive life plan. Patient is interested in Hormonal Implant. This was provided to the patient today.  Risks, benefits, and typical effectiveness rates were reviewed.  Questions were answered.  Written information was also given to the patient to review.    The patient will follow up in  1 years for surveillance.  The patient was told to call with any further questions, or with any concerns about this method of contraception.  Emphasized use of condoms 100% of the time for STI prevention.  Emergency Contraception Precautions (ECP): Patient assessed for need of ECP. She is not a candidate based on no intercourse for 1 month.   2. Encounter for removal and reinsertion of Nexplanon   - Pregnancy,  urine  Procedure:  Nexplanon  Removal and Insertion  Patient identified, informed consent performed, consent signed.   Patient does understand that irregular bleeding is a very common side effect of this medication. She was advised to have backup contraception for one week after replacement of the implant. Patient deemed to meet WHO criteria for being reasonably certain she is not pregnant.  Appropriate time out taken. Nexplanon  site identified in the patient's left arm. Area prepped in usual sterile fashon. 3 ml of 1% lidocaine  with epinephrine  was used to anesthetize the area at the distal end of the implant. A small stab incision was made right beside the implant on the distal portion. The Nexplanon  rod was grasped using hemostats and removed without difficulty. There was minimal blood loss. There were no complications.   Confirmed correct location of insertion site. The insertion site was identified 8-10 cm (3-4 inches) from the medial epicondyle of the humerus and 3-5 cm (1.25-2 inches) posterior to (below) the sulcus (groove) between the biceps and triceps muscles of the patient's left arm. New Nexplanon  removed from packaging, device confirmed in needle, then inserted full length of needle and withdrawn per handbook instructions. Nexplanon  was able to palpated in the patient's arm; patient palpated the insert herself.  There was minimal blood loss. Patient insertion site covered with guaze and a pressure bandage to reduce any bruising. The patient tolerated the procedure well and was given post procedure instructions.     Nexplanon :   Counseled patient to take OTC analgesic starting as soon as lidocaine  starts to wear off and take regularly for at least 48 hr to decrease discomfort.  Specifically to take with food or milk to decrease stomach upset and for IB 600 mg (3 tablets) every 6 hrs; IB 800 mg (4 tablets) every 8 hrs; or Aleve 2 tablets every 12 hrs.   3. Screening for cervical cancer  -  IGP, Aptima HPV  4. Screening examination for sexually transmitted disease  - WET PREP FOR TRICH, YEAST, CLUE - HIV [facility] - Syphilis Serology, [facility] - Chlamydia/Gonorrhea [facility]  5. Acne vulgaris  - Patient reports long history of acne, worse in last 2 months - Saw dermatologist years ago who  prescribed tretinoin , desires refill, cannot afford to see dermatology  - Counseled her not to use Retina-A with other topical acne treatments - tretinoin  (RETIN-A ) 0.05 % cream; Apply 1 Application topically at bedtime.  Dispense: 45 g; Refill: 5  6. Elevated blood pressure reading  - BP 138/92, patient denies hx of high blood pressure outside of pregnancy - Counseled her to check her BP at pharmacies, at home and note elevated readings - Seek PCP follow up and referred to [facility]   7. Yeast infection of the skin  - External vulvar yeast dermatitis  - clotrimazole  (GYNE-LOTRIMIN ) 1 % vaginal cream; cover affected area with thin layer of cream for 7 days or until resolved - If this cream does not work, seek gynecologic evaluation at ob/gyn provider  Return in about 1 year (around 11/19/2025).  No future appointments.  Damien FORBES Satchel, NP "

## 2024-11-22 LAB — IGP, APTIMA HPV
HPV Aptima: NEGATIVE
PAP Smear Comment: 0

## 2024-11-25 ENCOUNTER — Ambulatory Visit: Payer: Self-pay

## 2024-11-25 NOTE — Progress Notes (Signed)
 This patient's pap is normal/negative for intraepithelial lesion or malignancy. Negative HPV. She can follow up in 5 years.    Damien Satchel [facility]
